# Patient Record
Sex: Female | Born: 1945 | Race: White | Hispanic: No | Smoking: Former smoker
Health system: Southern US, Community
[De-identification: ages and names within clinical notes are randomized; demographics above are authoritative.]

## PROBLEM LIST (undated history)

## (undated) DIAGNOSIS — Z8719 Personal history of other diseases of the digestive system: Secondary | ICD-10-CM

## (undated) DIAGNOSIS — E1110 Type 2 diabetes mellitus with ketoacidosis without coma: Secondary | ICD-10-CM

## (undated) DIAGNOSIS — I214 Non-ST elevation (NSTEMI) myocardial infarction: Secondary | ICD-10-CM

## (undated) DIAGNOSIS — F32A Depression, unspecified: Secondary | ICD-10-CM

## (undated) DIAGNOSIS — I255 Ischemic cardiomyopathy: Secondary | ICD-10-CM

## (undated) DIAGNOSIS — F329 Major depressive disorder, single episode, unspecified: Secondary | ICD-10-CM

## (undated) DIAGNOSIS — F419 Anxiety disorder, unspecified: Secondary | ICD-10-CM

## (undated) DIAGNOSIS — R51 Headache: Secondary | ICD-10-CM

## (undated) DIAGNOSIS — B952 Enterococcus as the cause of diseases classified elsewhere: Secondary | ICD-10-CM

## (undated) DIAGNOSIS — M1990 Unspecified osteoarthritis, unspecified site: Secondary | ICD-10-CM

## (undated) DIAGNOSIS — I739 Peripheral vascular disease, unspecified: Secondary | ICD-10-CM

## (undated) DIAGNOSIS — I2699 Other pulmonary embolism without acute cor pulmonale: Secondary | ICD-10-CM

## (undated) DIAGNOSIS — D696 Thrombocytopenia, unspecified: Secondary | ICD-10-CM

## (undated) DIAGNOSIS — F0390 Unspecified dementia without behavioral disturbance: Secondary | ICD-10-CM

## (undated) DIAGNOSIS — C801 Malignant (primary) neoplasm, unspecified: Secondary | ICD-10-CM

## (undated) DIAGNOSIS — G9340 Encephalopathy, unspecified: Secondary | ICD-10-CM

## (undated) DIAGNOSIS — K219 Gastro-esophageal reflux disease without esophagitis: Secondary | ICD-10-CM

## (undated) DIAGNOSIS — I10 Essential (primary) hypertension: Secondary | ICD-10-CM

## (undated) DIAGNOSIS — D649 Anemia, unspecified: Secondary | ICD-10-CM

## (undated) DIAGNOSIS — E118 Type 2 diabetes mellitus with unspecified complications: Secondary | ICD-10-CM

## (undated) DIAGNOSIS — N390 Urinary tract infection, site not specified: Secondary | ICD-10-CM

## (undated) DIAGNOSIS — I2510 Atherosclerotic heart disease of native coronary artery without angina pectoris: Secondary | ICD-10-CM

## (undated) HISTORY — PX: CORONARY ANGIOPLASTY: SHX604

## (undated) HISTORY — PX: BACK SURGERY: SHX140

## (undated) HISTORY — PX: JOINT REPLACEMENT: SHX530

## (undated) HISTORY — PX: FRACTURE SURGERY: SHX138

## (undated) HISTORY — PX: APPENDECTOMY: SHX54

## (undated) HISTORY — PX: MASTECTOMY: SHX3

## (undated) HISTORY — PX: LEG AMPUTATION BELOW KNEE: SHX694

## (undated) HISTORY — PX: TONSILLECTOMY: SUR1361

## (undated) HISTORY — PX: CHOLECYSTECTOMY: SHX55

## (undated) HISTORY — PX: BREAST SURGERY: SHX581

## (undated) HISTORY — PX: ABDOMINAL HYSTERECTOMY: SHX81

## (undated) HISTORY — PX: CORONARY ARTERY BYPASS GRAFT: SHX141

---

## 2012-12-12 ENCOUNTER — Inpatient Hospital Stay (HOSPITAL_COMMUNITY)
Admission: AD | Admit: 2012-12-12 | Discharge: 2012-12-21 | DRG: 280 | Disposition: A | Discharge status: Home / Self Care | Payer: Medicare Other | Source: Other Acute Inpatient Hospital | Attending: Internal Medicine | Admitting: Internal Medicine

## 2012-12-12 ENCOUNTER — Encounter (HOSPITAL_COMMUNITY): Payer: Self-pay

## 2012-12-12 ENCOUNTER — Inpatient Hospital Stay (HOSPITAL_COMMUNITY): Payer: Medicare Other

## 2012-12-12 DIAGNOSIS — I5021 Acute systolic (congestive) heart failure: Secondary | ICD-10-CM | POA: Diagnosis present

## 2012-12-12 DIAGNOSIS — F0390 Unspecified dementia without behavioral disturbance: Secondary | ICD-10-CM | POA: Diagnosis present

## 2012-12-12 DIAGNOSIS — I509 Heart failure, unspecified: Secondary | ICD-10-CM | POA: Diagnosis present

## 2012-12-12 DIAGNOSIS — E1110 Type 2 diabetes mellitus with ketoacidosis without coma: Secondary | ICD-10-CM | POA: Diagnosis present

## 2012-12-12 DIAGNOSIS — G4700 Insomnia, unspecified: Secondary | ICD-10-CM | POA: Diagnosis present

## 2012-12-12 DIAGNOSIS — I359 Nonrheumatic aortic valve disorder, unspecified: Secondary | ICD-10-CM | POA: Diagnosis present

## 2012-12-12 DIAGNOSIS — X58XXXA Exposure to other specified factors, initial encounter: Secondary | ICD-10-CM | POA: Diagnosis present

## 2012-12-12 DIAGNOSIS — B952 Enterococcus as the cause of diseases classified elsewhere: Secondary | ICD-10-CM

## 2012-12-12 DIAGNOSIS — M1610 Unilateral primary osteoarthritis, unspecified hip: Secondary | ICD-10-CM | POA: Diagnosis present

## 2012-12-12 DIAGNOSIS — I9589 Other hypotension: Secondary | ICD-10-CM | POA: Diagnosis present

## 2012-12-12 DIAGNOSIS — N183 Chronic kidney disease, stage 3 unspecified: Secondary | ICD-10-CM | POA: Diagnosis present

## 2012-12-12 DIAGNOSIS — F411 Generalized anxiety disorder: Secondary | ICD-10-CM | POA: Diagnosis present

## 2012-12-12 DIAGNOSIS — I059 Rheumatic mitral valve disease, unspecified: Secondary | ICD-10-CM

## 2012-12-12 DIAGNOSIS — S88119A Complete traumatic amputation at level between knee and ankle, unspecified lower leg, initial encounter: Secondary | ICD-10-CM

## 2012-12-12 DIAGNOSIS — Z87440 Personal history of urinary (tract) infections: Secondary | ICD-10-CM

## 2012-12-12 DIAGNOSIS — F341 Dysthymic disorder: Secondary | ICD-10-CM | POA: Diagnosis present

## 2012-12-12 DIAGNOSIS — E109 Type 1 diabetes mellitus without complications: Secondary | ICD-10-CM | POA: Diagnosis present

## 2012-12-12 DIAGNOSIS — I2510 Atherosclerotic heart disease of native coronary artery without angina pectoris: Secondary | ICD-10-CM | POA: Diagnosis present

## 2012-12-12 DIAGNOSIS — F418 Other specified anxiety disorders: Secondary | ICD-10-CM | POA: Diagnosis present

## 2012-12-12 DIAGNOSIS — Z86711 Personal history of pulmonary embolism: Secondary | ICD-10-CM

## 2012-12-12 DIAGNOSIS — Z5948 Other specified lack of adequate food: Secondary | ICD-10-CM | POA: Diagnosis present

## 2012-12-12 DIAGNOSIS — Z9861 Coronary angioplasty status: Secondary | ICD-10-CM

## 2012-12-12 DIAGNOSIS — I440 Atrioventricular block, first degree: Secondary | ICD-10-CM | POA: Diagnosis present

## 2012-12-12 DIAGNOSIS — Z951 Presence of aortocoronary bypass graft: Secondary | ICD-10-CM

## 2012-12-12 DIAGNOSIS — N390 Urinary tract infection, site not specified: Secondary | ICD-10-CM | POA: Diagnosis present

## 2012-12-12 DIAGNOSIS — L89109 Pressure ulcer of unspecified part of back, unspecified stage: Secondary | ICD-10-CM | POA: Diagnosis present

## 2012-12-12 DIAGNOSIS — L8992 Pressure ulcer of unspecified site, stage 2: Secondary | ICD-10-CM | POA: Diagnosis present

## 2012-12-12 DIAGNOSIS — T730XXA Starvation, initial encounter: Secondary | ICD-10-CM | POA: Diagnosis present

## 2012-12-12 DIAGNOSIS — Z6828 Body mass index (BMI) 28.0-28.9, adult: Secondary | ICD-10-CM | POA: Diagnosis present

## 2012-12-12 DIAGNOSIS — E876 Hypokalemia: Secondary | ICD-10-CM | POA: Diagnosis present

## 2012-12-12 DIAGNOSIS — I079 Rheumatic tricuspid valve disease, unspecified: Secondary | ICD-10-CM | POA: Diagnosis present

## 2012-12-12 DIAGNOSIS — E669 Obesity, unspecified: Secondary | ICD-10-CM | POA: Diagnosis present

## 2012-12-12 DIAGNOSIS — D649 Anemia, unspecified: Secondary | ICD-10-CM | POA: Diagnosis present

## 2012-12-12 DIAGNOSIS — N179 Acute kidney failure, unspecified: Secondary | ICD-10-CM | POA: Diagnosis present

## 2012-12-12 DIAGNOSIS — Y92009 Unspecified place in unspecified non-institutional (private) residence as the place of occurrence of the external cause: Secondary | ICD-10-CM

## 2012-12-12 DIAGNOSIS — Z794 Long term (current) use of insulin: Secondary | ICD-10-CM

## 2012-12-12 DIAGNOSIS — R5381 Other malaise: Secondary | ICD-10-CM | POA: Diagnosis present

## 2012-12-12 DIAGNOSIS — E1010 Type 1 diabetes mellitus with ketoacidosis without coma: Secondary | ICD-10-CM | POA: Diagnosis present

## 2012-12-12 DIAGNOSIS — K219 Gastro-esophageal reflux disease without esophagitis: Secondary | ICD-10-CM | POA: Diagnosis present

## 2012-12-12 DIAGNOSIS — I255 Ischemic cardiomyopathy: Secondary | ICD-10-CM | POA: Diagnosis present

## 2012-12-12 DIAGNOSIS — Z853 Personal history of malignant neoplasm of breast: Secondary | ICD-10-CM

## 2012-12-12 DIAGNOSIS — E860 Dehydration: Secondary | ICD-10-CM | POA: Diagnosis present

## 2012-12-12 DIAGNOSIS — I129 Hypertensive chronic kidney disease with stage 1 through stage 4 chronic kidney disease, or unspecified chronic kidney disease: Secondary | ICD-10-CM | POA: Diagnosis present

## 2012-12-12 DIAGNOSIS — I2589 Other forms of chronic ischemic heart disease: Secondary | ICD-10-CM | POA: Diagnosis present

## 2012-12-12 DIAGNOSIS — G9340 Encephalopathy, unspecified: Secondary | ICD-10-CM | POA: Diagnosis present

## 2012-12-12 DIAGNOSIS — I214 Non-ST elevation (NSTEMI) myocardial infarction: Secondary | ICD-10-CM | POA: Diagnosis present

## 2012-12-12 DIAGNOSIS — D696 Thrombocytopenia, unspecified: Secondary | ICD-10-CM | POA: Diagnosis present

## 2012-12-12 DIAGNOSIS — I739 Peripheral vascular disease, unspecified: Secondary | ICD-10-CM | POA: Diagnosis present

## 2012-12-12 DIAGNOSIS — D509 Iron deficiency anemia, unspecified: Secondary | ICD-10-CM | POA: Diagnosis present

## 2012-12-12 HISTORY — DX: Encephalopathy, unspecified: G93.40

## 2012-12-12 HISTORY — DX: Anxiety disorder, unspecified: F41.9

## 2012-12-12 HISTORY — DX: Essential (primary) hypertension: I10

## 2012-12-12 HISTORY — DX: Unspecified dementia, unspecified severity, without behavioral disturbance, psychotic disturbance, mood disturbance, and anxiety: F03.90

## 2012-12-12 HISTORY — DX: Malignant (primary) neoplasm, unspecified: C80.1

## 2012-12-12 HISTORY — DX: Peripheral vascular disease, unspecified: I73.9

## 2012-12-12 HISTORY — DX: Depression, unspecified: F32.A

## 2012-12-12 HISTORY — DX: Enterococcus as the cause of diseases classified elsewhere: B95.2

## 2012-12-12 HISTORY — DX: Other pulmonary embolism without acute cor pulmonale: I26.99

## 2012-12-12 HISTORY — DX: Atherosclerotic heart disease of native coronary artery without angina pectoris: I25.10

## 2012-12-12 HISTORY — DX: Headache: R51

## 2012-12-12 HISTORY — DX: Major depressive disorder, single episode, unspecified: F32.9

## 2012-12-12 HISTORY — DX: Gastro-esophageal reflux disease without esophagitis: K21.9

## 2012-12-12 HISTORY — DX: Type 2 diabetes mellitus with unspecified complications: E11.8

## 2012-12-12 HISTORY — DX: Non-ST elevation (NSTEMI) myocardial infarction: I21.4

## 2012-12-12 HISTORY — DX: Anemia, unspecified: D64.9

## 2012-12-12 HISTORY — DX: Thrombocytopenia, unspecified: D69.6

## 2012-12-12 HISTORY — DX: Ischemic cardiomyopathy: I25.5

## 2012-12-12 HISTORY — DX: Type 2 diabetes mellitus with ketoacidosis without coma: E11.10

## 2012-12-12 HISTORY — DX: Personal history of other diseases of the digestive system: Z87.19

## 2012-12-12 HISTORY — DX: Unspecified osteoarthritis, unspecified site: M19.90

## 2012-12-12 HISTORY — DX: Urinary tract infection, site not specified: N39.0

## 2012-12-12 LAB — POCT I-STAT, CHEM 8
BUN: 53 mg/dL — ABNORMAL HIGH (ref 6–23)
Creatinine, Ser: 4.6 mg/dL — ABNORMAL HIGH (ref 0.50–1.10)
Potassium: 4.4 mEq/L (ref 3.5–5.1)
Sodium: 141 mEq/L (ref 135–145)
TCO2: 12 mmol/L (ref 0–100)

## 2012-12-12 LAB — BASIC METABOLIC PANEL
BUN: 51 mg/dL — ABNORMAL HIGH (ref 6–23)
BUN: 52 mg/dL — ABNORMAL HIGH (ref 6–23)
BUN: 44 mg/dL — ABNORMAL HIGH (ref 6–23)
BUN: 41 mg/dL — ABNORMAL HIGH (ref 6–23)
CO2: 9 mEq/L — CL (ref 19–32)
CO2: 12 mEq/L — ABNORMAL LOW (ref 19–32)
Calcium: 7.2 mg/dL — ABNORMAL LOW (ref 8.4–10.5)
Calcium: 8.2 mg/dL — ABNORMAL LOW (ref 8.4–10.5)
Calcium: 8 mg/dL — ABNORMAL LOW (ref 8.4–10.5)
Chloride: 111 mEq/L (ref 96–112)
Creatinine, Ser: 4.85 mg/dL — ABNORMAL HIGH (ref 0.50–1.10)
GFR calc Af Amer: 10 mL/min — ABNORMAL LOW (ref >90)
GFR calc Af Amer: 14 mL/min — ABNORMAL LOW (ref >90)
GFR calc non Af Amer: 8 mL/min — ABNORMAL LOW (ref >90)
GFR calc non Af Amer: 8 mL/min — ABNORMAL LOW (ref >90)
GFR calc non Af Amer: 9 mL/min — ABNORMAL LOW (ref >90)
GFR calc non Af Amer: 12 mL/min — ABNORMAL LOW (ref >90)
GFR calc non Af Amer: 13 mL/min — ABNORMAL LOW (ref >90)
Glucose, Bld: 350 mg/dL — ABNORMAL HIGH (ref 70–99)
Glucose, Bld: 316 mg/dL — ABNORMAL HIGH (ref 70–99)
Glucose, Bld: 189 mg/dL — ABNORMAL HIGH (ref 70–99)
Potassium: 4.5 mEq/L (ref 3.5–5.1)
Potassium: 3.7 mEq/L (ref 3.5–5.1)
Sodium: 137 mEq/L (ref 135–145)
Sodium: 141 mEq/L (ref 135–145)
Sodium: 141 mEq/L (ref 135–145)

## 2012-12-12 LAB — CBC WITH DIFFERENTIAL/PLATELET
Eosinophils Absolute: 0 10*3/uL (ref 0.0–0.7)
Eosinophils Relative: 0 % (ref 0–5)
HCT: 29.5 % — ABNORMAL LOW (ref 36.0–46.0)
Hemoglobin: 10 g/dL — ABNORMAL LOW (ref 12.0–15.0)
Lymphs Abs: 1.9 10*3/uL (ref 0.7–4.0)
MCH: 30.3 pg (ref 26.0–34.0)
MCHC: 33.9 g/dL (ref 30.0–36.0)
MCV: 89.4 fL (ref 78.0–100.0)
Monocytes Absolute: 0.8 10*3/uL (ref 0.1–1.0)
Monocytes Relative: 10 % (ref 3–12)
RBC: 3.3 MIL/uL — ABNORMAL LOW (ref 3.87–5.11)

## 2012-12-12 LAB — MAGNESIUM
Magnesium: 1.4 mg/dL — ABNORMAL LOW (ref 1.5–2.5)
Magnesium: 2 mg/dL (ref 1.5–2.5)

## 2012-12-12 LAB — GLUCOSE, CAPILLARY
Glucose-Capillary: 334 mg/dL — ABNORMAL HIGH (ref 70–99)
Glucose-Capillary: 285 mg/dL — ABNORMAL HIGH (ref 70–99)
Glucose-Capillary: 241 mg/dL — ABNORMAL HIGH (ref 70–99)
Glucose-Capillary: 161 mg/dL — ABNORMAL HIGH (ref 70–99)
Glucose-Capillary: 106 mg/dL — ABNORMAL HIGH (ref 70–99)
Glucose-Capillary: 82 mg/dL (ref 70–99)
Glucose-Capillary: 103 mg/dL — ABNORMAL HIGH (ref 70–99)
Glucose-Capillary: 176 mg/dL — ABNORMAL HIGH (ref 70–99)

## 2012-12-12 LAB — TROPONIN I: Troponin I: 4.45 ng/mL (ref <0.30)

## 2012-12-12 LAB — HEPATIC FUNCTION PANEL
Albumin: 3 g/dL — ABNORMAL LOW (ref 3.5–5.2)
Alkaline Phosphatase: 65 U/L (ref 39–117)
Total Bilirubin: 0.2 mg/dL — ABNORMAL LOW (ref 0.3–1.2)

## 2012-12-12 MED ORDER — HEPARIN (PORCINE) IN NACL 100-0.45 UNIT/ML-% IJ SOLN
1000.0000 [IU]/h | INTRAMUSCULAR | Status: DC
  Administered 2012-12-12: 750 [IU]/h via INTRAVENOUS
  Administered 2012-12-13: 1000 [IU]/h via INTRAVENOUS
  Administered 2012-12-13: 900 [IU]/h via INTRAVENOUS
  Administered 2012-12-13: 1000 [IU]/h via INTRAVENOUS
  Filled 2012-12-12 (×5): qty 250

## 2012-12-12 MED ORDER — HALOPERIDOL LACTATE 5 MG/ML IJ SOLN
INTRAMUSCULAR | Status: AC
  Filled 2012-12-12: qty 1

## 2012-12-12 MED ORDER — HALOPERIDOL LACTATE 5 MG/ML IJ SOLN
5.0000 mg | Freq: Once | INTRAMUSCULAR | Status: AC
  Administered 2012-12-12: 5 mg via INTRAVENOUS

## 2012-12-12 MED ORDER — ALPRAZOLAM 0.25 MG PO TABS: 0.2500 mg | ORAL_TABLET | Freq: Three times a day (TID) | ORAL | Status: DC | PRN

## 2012-12-12 MED ORDER — DEXTROSE 50 % IV SOLN: 25.0000 mL | INTRAVENOUS | Status: DC | PRN

## 2012-12-12 MED ORDER — DEXTROSE-NACL 5-0.45 % IV SOLN
INTRAVENOUS | Status: DC
  Administered 2012-12-12: 06:00:00 via INTRAVENOUS

## 2012-12-12 MED ORDER — ATORVASTATIN CALCIUM 40 MG PO TABS
40.0000 mg | ORAL_TABLET | Freq: Every day | ORAL | Status: DC
  Filled 2012-12-12: qty 1

## 2012-12-12 MED ORDER — MAGNESIUM SULFATE 40 MG/ML IJ SOLN
2.0000 g | Freq: Once | INTRAMUSCULAR | Status: AC
  Administered 2012-12-12: 2 g via INTRAVENOUS
  Filled 2012-12-12: qty 50

## 2012-12-12 MED ORDER — ASPIRIN 81 MG PO CHEW
81.0000 mg | CHEWABLE_TABLET | Freq: Every day | ORAL | Status: DC
  Administered 2012-12-13 – 2012-12-21 (×8): 81 mg via ORAL
  Filled 2012-12-12 (×9): qty 1

## 2012-12-12 MED ORDER — PANTOPRAZOLE SODIUM 40 MG PO TBEC
40.0000 mg | DELAYED_RELEASE_TABLET | Freq: Every day | ORAL | Status: DC
  Administered 2012-12-12 – 2012-12-16 (×5): 40 mg via ORAL
  Filled 2012-12-12 (×5): qty 1

## 2012-12-12 MED ORDER — DEXTROSE 10 % IV SOLN
INTRAVENOUS | Status: DC
  Administered 2012-12-12 – 2012-12-13 (×4): via INTRAVENOUS

## 2012-12-12 MED ORDER — SODIUM CHLORIDE 0.9 % IV SOLN
1.0000 g | Freq: Once | INTRAVENOUS | Status: AC
  Administered 2012-12-12: 1 g via INTRAVENOUS
  Filled 2012-12-12: qty 10

## 2012-12-12 MED ORDER — SODIUM CHLORIDE 0.9 % IV SOLN
INTRAVENOUS | Status: AC
  Administered 2012-12-12: 02:00:00 via INTRAVENOUS

## 2012-12-12 MED ORDER — SODIUM CHLORIDE 0.9 % IV SOLN: INTRAVENOUS | Status: DC

## 2012-12-12 MED ORDER — FENTANYL CITRATE 0.05 MG/ML IJ SOLN
100.0000 ug | INTRAMUSCULAR | Status: DC | PRN
  Administered 2012-12-12: 50 ug via INTRAVENOUS
  Administered 2012-12-15: 100 ug via INTRAVENOUS
  Filled 2012-12-12 (×2): qty 2

## 2012-12-12 MED ORDER — ASPIRIN 325 MG PO TABS
325.0000 mg | ORAL_TABLET | Freq: Every day | ORAL | Status: DC
  Administered 2012-12-12: 325 mg via ORAL
  Filled 2012-12-12: qty 1

## 2012-12-12 MED ORDER — ATORVASTATIN CALCIUM 80 MG PO TABS
80.0000 mg | ORAL_TABLET | Freq: Every day | ORAL | Status: DC
Start: 2012-12-12 — End: 2012-12-21
  Administered 2012-12-13 – 2012-12-20 (×8): 80 mg via ORAL
  Filled 2012-12-12 (×10): qty 1

## 2012-12-12 MED ORDER — HEPARIN SODIUM (PORCINE) 5000 UNIT/ML IJ SOLN: 5000.0000 [IU] | Freq: Three times a day (TID) | INTRAMUSCULAR | Status: DC

## 2012-12-12 MED ORDER — INSULIN REGULAR HUMAN 100 UNIT/ML IJ SOLN
INTRAMUSCULAR | Status: DC
  Administered 2012-12-12: 3.3 [IU]/h via INTRAVENOUS
  Administered 2012-12-13: 1.9 [IU]/h via INTRAVENOUS
  Filled 2012-12-12 (×4): qty 1

## 2012-12-12 MED ORDER — CLOPIDOGREL BISULFATE 75 MG PO TABS
75.0000 mg | ORAL_TABLET | Freq: Every day | ORAL | Status: DC
  Administered 2012-12-12 – 2012-12-21 (×10): 75 mg via ORAL
  Filled 2012-12-12 (×13): qty 1

## 2012-12-12 NOTE — Progress Notes (Signed)
Inpatient Diabetes Program Recommendations  AACE/ADA: New Consensus Statement on Inpatient Glycemic Control (2013)  Target Ranges:  Prepandial:   less than 140 mg/dL      Peak postprandial:   less than 180 mg/dL (1-2 hours)      Critically ill patients:  140 - 180 mg/dL   67 yo female admitted with DKA.  Diabetes Coordinator is following. Thank you  Piedad Climes BSN, RN,CDE Inpatient Diabetes Coordinator [phone] (team pager)

## 2012-12-12 NOTE — Progress Notes (Signed)
ANTICOAGULATION CONSULT NOTE - Follow Up Consult  Pharmacy Consult for Heparin Indication: chest pain/ACS  Allergies  Allergen Reactions  . Ciprofloxacin Hcl   . Nitrofuran Derivatives   . Penicillins   . Quetiapine Fumarate   . Sulfa Antibiotics     Patient Measurements: Height: 5\' 3"  (160 cm) Weight: 161 lb 3.2 oz (73.12 kg) IBW/kg (Calculated) : 52.4 Heparin Dosing Weight: 67 kg  Vital Signs: Temp: 98.3 F (36.8 C) (03/26 0726) Temp src: Oral (03/26 0726) BP: 99/39 mmHg (03/26 0800) Pulse Rate: 102 (03/26 0800)  Labs:  Recent Labs  12/12/12 0115 12/12/12 0229 12/12/12 0230 12/12/12 0300 12/12/12 0809  HGB 10.0* 10.9*  --   --   --   HCT 29.5* 32.0*  --   --   --   PLT 79*  --   --   --   --   HEPARINUNFRC  --   --   --   --  0.26*  CREATININE 4.84* 4.60*  --  4.85* 4.60*  TROPONINI  --   --  4.45*  --  7.72*    Estimated Creatinine Clearance: 11.2 ml/min (by C-G formula based on Cr of 4.6).   Medications:  Scheduled:  . aspirin  325 mg Oral Daily  . atorvastatin  40 mg Oral q1800  . [COMPLETED] calcium gluconate  1 g Intravenous Once  . clopidogrel  75 mg Oral Q breakfast  . [COMPLETED] magnesium sulfate 1 - 4 g bolus IVPB  2 g Intravenous Once  . pantoprazole  40 mg Oral Daily  . [DISCONTINUED] heparin  5,000 Units Subcutaneous Q8H   Infusions:  . [EXPIRED] sodium chloride 999 mL/hr at 12/12/12 0200  . sodium chloride    . dextrose 5 % and 0.45% NaCl 100 mL/hr at 12/12/12 0900  . heparin 750 Units/hr (12/12/12 0900)  . insulin (NOVOLIN-R) infusion 9.1 Units/hr (12/12/12 1204)    Assessment: 67 yo F transferred to [facility] from [facility] with DKA, ARF, AMS, and elevated cardiac enzymes.  Heparin level is subtherapeutic on 750 units/hr of heparin.  No IV issues noted.  Goal of Therapy:  Heparin level 0.3-0.7 units/ml Monitor platelets by anticoagulation protocol: Yes   Plan:  Increase heparin to 900 units/hr. Recheck heparin level in 8  hours. Continue daily heparin level and CBC.  [employer], Pharm.D., BCPS Clinical Pharmacist Pager [phone] 12/12/2012 12:28 PM

## 2012-12-12 NOTE — Consult Note (Signed)
CARDIOLOGY CONSULT NOTE  Patient ID: Christine Hughes MRN: [id number], DOB/AGE: 06-10-1946   Admit date: 12/12/2012 Date of Consult: 12/12/2012   Primary Physician: Katherina Right, MD Primary Cardiologist: B. Earna Coder, MD  Pt. Profile  67 year old female who presents to [facility] with DKA, ARF and elevated Troponins from [facility] ED.   Problem List  Past Medical History  Diagnosis Date  . Coronary artery disease     a. s/p prior MI;  b. CABG 1999 @ [facility]; c. s/p stenting 2009 or 10.  Marland Kitchen Hypertension   . Anxiety   . Depression   . Dementia   . Diabetes mellitus with complication     a. 11/2012 DKA  . GERD (gastroesophageal reflux disease)     Bilateral BKAs  . H/O hiatal hernia   . Headache   . Arthritis   . Anemia   . Cancer     L breast cancer  . Pulmonary embolism     a. prev coumadin    Past Surgical History  Procedure Laterality Date  . Coronary angioplasty    . Back surgery    . Coronary artery bypass graft    . Appendectomy    . Abdominal hysterectomy    . Mastectomy    . Joint replacement      Hip  . Breast surgery      left  . Fracture surgery      R femur  . Cholecystectomy    . Tonsillectomy    . Leg amputation below knee Bilateral     Allergies  Allergies  Allergen Reactions  . Ciprofloxacin Hcl   . Nitrofuran Derivatives   . Penicillins   . Quetiapine Fumarate   . Sulfa Antibiotics    HPI   67 yo female with the above problem list.  She has a long h/o CAD as outlined above, though records are not currently available.  She believes she was last intervened upon sometime in 2009 or 2010, in [place].  She also has a h/o DM and PVD and is s/p bilat BKA's, which limits activity.  She does experience periodic chest pain, usually at rest, approx 1x every few mos.  She rarely has to use sl NTG.  She has a long h/o colitis, which has been worse over the past 4 mos.  Earlier this week developed recurrent diarrhea.  In that setting, she held her usual  insulin dose because she was not eating.  She did have a brief episode of chest discomfort earlier this week, for which, she took sl NTG.  Her dtr began to note alteration in mental status and altered responsiveness.  She was taken to the [facility] ED last night and found be hyperglycemic with acute on chronic renal failure and hyperkalemia. Troponin was elevated as well.  She was  transferred to [facility] for further management of DKA. She currently denies cp,sob, n/v, lightheadedness/dizziness,  diaphoresis, palpitations, DOE, orthopnea.  Troponin has been rising and is currently 7.72.  We've been asked to eval.  Inpatient Medications  . aspirin  81 mg Oral Daily  . atorvastatin  80 mg Oral q1800  . clopidogrel  75 mg Oral Q breakfast  . pantoprazole  40 mg Oral Daily    *  Glucommander  Family History  Pt unwilling to answer at this time.   Social History History   Social History  . Marital Status: Unknown    Spouse Name: N/A    Number of Children: N/A  .  Years of Education: N/A   Occupational History  . Not on file.   Social History Main Topics  . Smoking status: Former Games developer  . Smokeless tobacco: Never Used     Comment: prev tob, quit 1999  . Alcohol Use: No  . Drug Use: No  . Sexually Active: Not on file   Other Topics Concern  . Not on file   Social History Narrative   Lives at [facility]     Review of Systems  General:  No chills, fever, night sweats or weight changes.  Cardiovascular:  ++chest pain, No dyspnea on exertion, edema, orthopnea, palpitations, paroxysmal nocturnal dyspnea. Dermatological: No rash, lesions/masses Respiratory: No cough, dyspnea Urologic: No hematuria, dysuria Abdominal:   ++ Diarrhea, No nausea, vomiting, bright red blood per rectum, melena, or hematemesis Neurologic:  ++ Dementia, No visual changes, wkns All other systems reviewed and are otherwise negative except as noted above.  Physical Exam  Blood pressure 100/37, pulse 98,  temperature 99 F (37.2 C), temperature source Oral, resp. rate 13, height 5\' 3"  (1.6 m), weight 161 lb 3.2 oz (73.12 kg), SpO2 98.00%.  General: Pleasant, NAD Psych: Flat affect. Neuro: Alert and oriented X 3. Moves all extremities spontaneously. HEENT: Normal  Neck: Supple without bruits or JVD. Lungs:  Resp regular and unlabored, fine crackles heard in bilateral lower lobes. Heart: RRR no s3, s4, 2/6 systolic murmur at [facility].  R Carotid Bruit Abdomen: Soft, non-tender, non-distended, BS + x 4.  Extremities: No clubbing, cyanosis or edema. Popliteals 1+ and equal bilaterally, Radials 2+ and equal bilaterally. Bilateral amputations.    Labs   Recent Labs  12/12/12 0230 12/12/12 0809  TROPONINI 4.45* 7.72*   Lab Results  Component Value Date   WBC 8.0 12/12/2012   HGB 10.9* 12/12/2012   HCT 32.0* 12/12/2012   MCV 89.4 12/12/2012   PLT 79* 12/12/2012    Recent Labs Lab 12/12/12 0115  12/12/12 0809  NA 137  < > 141  K 4.0  < > 4.2  CL 111  < > 114*  CO2 9*  < > 12*  BUN 51*  < > 51*  CREATININE 4.84*  < > 4.60*  CALCIUM 7.2*  < > 8.2*  PROT 5.4*  --   --   BILITOT 0.2*  --   --   ALKPHOS 65  --   --   ALT 18  --   --   AST 28  --   --   GLUCOSE 350*  < > 241*  < > = values in this interval not displayed.  Radiology/Studies  Dg Chest Port 1 View  12/12/2012  *RADIOLOGY REPORT*  Clinical Data: Pneumonia.  Short of breath.  PORTABLE CHEST - 1 VIEW  Comparison: 11/29/2012.  Findings: Low volume chest. CABG.  Mild basilar atelectasis. Monitoring leads are projected over the chest. No airspace disease. Negative for pneumonia.  No effusion.  IMPRESSION: Low volume chest without acute cardiopulmonary disease.   Original Report Authenticated By: Andreas Newport, M.D.    ECGg  1st degree AVB, 103 BPM, ST/T wave abnormalities in II, III, AVF, V4-V6. No previous EKGs available for comparison. Low voltage in the limb leads is noted.    ECHO - Left ventricle: Techncially severely  limited. Moderate hypokinesis of inferior and inferolateral walls. EF probably 45%, but can not see apex well. The cavity size was normal. - Aortic valve: Sclerosis without stenosis. No significant regurgitation. - Mitral valve: Mild regurgitation. - Left  atrium: The atrium was mildly dilated. - Right ventricle: Not able to assess RV function due to poor visualization. I suspect some RV dysfunction. The cavity size was mildly dilated. - Tricuspid valve: Poorly visualized. Mild regurgitation.  ASSESSMENT AND PLAN  1. NSTEMI/CAD: Positive Troponins with upward trend in setting of DKA. Last Troponin 7.72. EF 45% per ECHO today.  ? Historic EF.  Will try to obtain records from Dr. Albertina Senegal office.  As she is denying ischemic symptoms at this time, BUN/Creatinine elevated, will plan conservative medical therapy for the time being. Continue ASA, Heparin gtt, Plavix, and Statin therapies. Lipitor increased from 40mg  daily to 80mg  daily.  No bb 2/2 baseline hypotn. We will reconsider cath after retrieving records dependent upon whether or not she has more Ss and whether or not renal fxn improves.  2. Acute Renal Failure: BUN/Creatinine elevated. No baseline labs available. Making adequate urine at this time. Per daughter, she takes Lisinopril and has a nephrologist @ home. Currently holding home Lisinopril dose 2/2 to hypotension and renal failure.  Consider nephrology consult if renal fxn does not return towards baseline.   3. Thrombocytopenia: Platelet count on admission was 79. Heparin gtt infusing. Monitor platelet count closely. May have to d/c Heparin gtt if platelet count continues to drop and bleeding is present.    4. DKA: Remains on insulin gtt. Management per primary team.   Signed, Nicolasa Ducking, NP 12/12/2012, 1:47 PM  Patient seen and examined and case discussed with patient and daughter.  Main issues have clearly been diahreaa over the past several months with a big workup.  She was  not eating, stopped insulin  (long history of IDDM), and then presents with DKA and acute on chronic renal insufficiency.  She has intermittent chest pain, prior CABG with subsequent PCI on DAPT.  Troponons are modestly elevated and have continued to climb somewhat, now 13.73.  She currently however is asymptomatic, and there is no STE on her ECG.  She has reduced LV with an inferolateral wall motion abnormality.  We are trying to get her records from [place].    1.  Currently recommend continuing heparin for now. 2.  Recheck Troponin serially.  3.  Would defer cath in the absence of STE or ongoing ischemic pain given severe renal dysfunction as contrast likely to lead to AKI, and likelihood of finding something that can be changed is not highly likely.    Our team will follow closely.

## 2012-12-12 NOTE — H&P (Signed)
PULMONARY  / CRITICAL CARE MEDICINE  Name: Christine Hughes MRN: [id number] DOB: July 01, 1946    ADMISSION DATE:  12/12/2012 CONSULTATION DATE:  12/12/2012  REFERRING MD :  Transferred from [place]  CHIEF COMPLAINT:  DKA  BRIEF PATIENT DESCRIPTION: 67 yo with frequent UTIs / abx use, subacute diarrhea, DM1, reportedly not taking insulin x 24 hours brought to [facility] ED with acute encephalopathy.  Found to be in DKA, acute renal failure, with elevated troponin.  Transferred to [facility] for further management.   PAST MEDICAL HISTORY :  DM1 Dementia Bilateral BKA CAD (stents, CABG) - Dr Earna Coder in [place], [place].  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 Mega workup per dtr - non diagnostic    SIGNIFICANT EVENTS / STUDIES:  3/26  Admitted from [facility] [place] [facility] ED with DKA, AKI, NSTEMI and encephalopathy  LINES / TUBES: fole  CULTURES: 3/26  Blood >>> 3/26  Urine >>> 3/26 MRSA PCR -neg 3/26  C diff (ordered, not sent) >>    ANTIBIOTICS:  INTERVAL HISTORY: 3/26 10:25 AM rounds: Troponin very high. Non sustained asymptomatioc V TACH ago. Still in acute renal failure   VITAL SIGNS: Temp:  [98.3 F (36.8 C)-98.5 F (36.9 C)] 98.3 F (36.8 C) (03/26 0726) Pulse Rate:  [100-110] 102 (03/26 0800) Resp:  [11-23] 13 (03/26 0800) BP: (86-115)/(10-43) 99/39 mmHg (03/26 0800) SpO2:  [100 %] 100 % (03/26 0800) Weight:  [63.5 kg (139 lb 15.9 oz)-73.12 kg (161 lb 3.2 oz)] 73.12 kg (161 lb 3.2 oz) (03/26 0445)  HEMODYNAMICS:   VENTILATOR SETTINGS:   INTAKE / OUTPUT: Intake/Output     03/25 0701 - 03/26 0700 03/26 0701 - 03/27 0700   I.V. (mL/kg) 2665.4 (36.5) 231.9 (3.2)   IV Piggyback 160    Total Intake(mL/kg) 2825.4 (38.6) 231.9 (3.2)   Urine (mL/kg/hr) 950    Total Output 950     Net +1875.4 +231.9          PHYSICAL EXAMINATION: General:  Appears acutely ill, in nor respiratory distress Neuro:  Encephalopathic,  follows some commands, nonfocal HEENT:  PERRL, dry membranes Cardiovascular:  Tachycardic, regular Lungs:  Bilateral diminished air entry, no w/r/r Abdomen:  Soft, nontender, bowel sounds hyperactive Musculoskeletal:  Bilateral BKA Skin:  Intact  IMAGING x 48h Dg Chest Port 1 View  12/12/2012  *RADIOLOGY REPORT*  Clinical Data: Pneumonia.  Short of breath.  PORTABLE CHEST - 1 VIEW  Comparison: 11/29/2012.  Findings: Low volume chest. CABG.  Mild basilar atelectasis. Monitoring leads are projected over the chest. No airspace disease. Negative for pneumonia.  No effusion.  IMPRESSION: Low volume chest without acute cardiopulmonary disease.   Original Report Authenticated By: Andreas Newport, M.D.       ASSESSMENT / PLAN:  PULMONARY  Recent Labs Lab 12/12/12 0229  TCO2 12    A:  No active issues. P:   Gaol SpO2>92 Supplemental oxygen PRn  CARDIOVASCULAR No results found for this basename: PROBNP,  in the last 168 hours   Recent Labs Lab 12/12/12 0230 12/12/12 0809  TROPONINI 4.45* 7.72*    A: CAD (stents, CABG).  NSTEMI vs demand ischemia in setting of DKA.  Hypotension. Sees Dr Earna Coder in [place], [place]. Has Non sustained V tAch P:  [facility] cards called - dtr wants to make sure they call her local cardiologit Goal MAP>60-65 Trend Troponin 2D echo Heparin gtt Continue ASA, Plavix, Atorvastatin Hold Metoprolol / Isordil (hypotension) and  Lasix / Lisinopril (AKI)   RENAL  Recent Labs Lab 12/12/12 0115 12/12/12 0229 12/12/12 0300  NA 137 141 137  K 4.0 4.4 4.5  CL 111 120* 111  CO2 9*  --  12*  GLUCOSE 350* 345* 316*  BUN 51* 53* 52*  CREATININE 4.84* 4.60* 4.85*  CALCIUM 7.2*  --  7.7*  MG 1.4*  --   --   PHOS 3.2  --   --    Intake/Output     03/25 0701 - 03/26 0700 03/26 0701 - 03/27 0700   I.V. (mL/kg) 2665.4 (36.5) 231.9 (3.2)   IV Piggyback 160    Total Intake(mL/kg) 2825.4 (38.6) 231.9 (3.2)   Urine (mL/kg/hr) 950 400 (1.6)   Total Output 950  400   Net +1875.4 -168.1          A:  AKI in setting of hypovolemia (diarrhea), ACEI, diuretics.  Hypomagnesemia.  Hypocalcemia.   P:   Recheck mag Trend BMP IVF per DKA protocol  GASTROINTESTINAL  Recent Labs Lab 12/12/12 0115  AST 28  ALT 18  ALKPHOS 65  BILITOT 0.2*  PROT 5.4*  ALBUMIN 3.0*    A:  Chronic diarrhea, reportedly negative colonoscopy P:   NPO as encephalopathic Protonix as on PPI preadmission  HEMATOLOGIC  Recent Labs Lab 12/12/12 0115 12/12/12 0229  HGB 10.0* 10.9*  HCT 29.5* 32.0*  WBC 8.0  --   PLT 79*  --    No results found for this basename: INR,  in the last 168 hours  A:  Anemia.  Thrombocytopenia (dtr denies heparin/lovenox psat 30 dayus) P:  Trend CBC DVT Px is not indicated as on Heparin gtt Monitor platelet count   INFECTIOUS  Recent Labs Lab 12/12/12 0327  LATICACIDVEN 1.0   No results found for this basename: PROCALCITON,  in the last 168 hours  A:  No clear source of acute infection.  Multiple abx allergies.  Frequent UTIs. P:   Cultures as above C. Diff PCR Defer antibiotics  ENDOCRINE  CBG (last 3)   Recent Labs  12/12/12 0804 12/12/12 0908 12/12/12 1011  GLUCAP 241* 204* 171*     A:  DM1.  DKA, P:   DKA protocol  NEUROLOGIC A:  Acute encephalopathy.  Dementia.  CT head reportedly negative in [place]. 12/12/12 am rounds - some better P:   Hold Trazodone, Donepezil, Citalopram, Xanax.  T  CRITICAL CARE:  The patient is critically ill with multiple organ systems failure and requires high complexity decision making for assessment and support, frequent evaluation and titration of therapies, application of advanced monitoring technologies and extensive interpretation of multiple databases. Critical Care Time devoted to patient care services described in this note is 46  minutes additional    Dr. Kalman Shan, M.D., [facility].C.P Pulmonary and Critical Care Medicine Staff Physician [place]  [facility] [place] [facility] Pager: [phone], If no answer or between  15:00h - 7:00h: call [phone]  12/12/2012 10:46 AM

## 2012-12-12 NOTE — Progress Notes (Signed)
ANTICOAGULATION CONSULT NOTE - Follow Up Consult  Pharmacy Consult for Heparin Indication: chest pain/ACS  Allergies  Allergen Reactions  . Ciprofloxacin Hcl   . Nitrofuran Derivatives   . Penicillins   . Quetiapine Fumarate   . Sulfa Antibiotics     Patient Measurements: Height: 5\' 3"  (160 cm) Weight: 161 lb 3.2 oz (73.12 kg) IBW/kg (Calculated) : 52.4 Heparin Dosing Weight: 67 kg  Vital Signs: Temp: 98.9 F (37.2 C) (03/26 2000) Temp src: Oral (03/26 2000) BP: 112/38 mmHg (03/26 2100) Pulse Rate: 92 (03/26 2100)  Labs:  Recent Labs  12/12/12 0115 12/12/12 0229 12/12/12 0230 12/12/12 0300 12/12/12 0809 12/12/12 1315 12/12/12 1728 12/12/12 2100  HGB 10.0* 10.9*  --   --   --   --   --   --   HCT 29.5* 32.0*  --   --   --   --   --   --   PLT 79*  --   --   --   --   --   --   --   HEPARINUNFRC  --   --   --   --  0.26*  --   --  0.40  CREATININE 4.84* 4.60*  --  4.85* 4.60*  --  3.68*  --   TROPONINI  --   --  4.45*  --  7.72* 13.73*  --   --     Estimated Creatinine Clearance: 14 ml/min (by C-G formula based on Cr of 3.68).   Assessment: 67 yo F transferred to [facility] from [facility] with DKA, ARF, AMS, and elevated cardiac enzymes.  Continues on heparin infusion. Heparin level is therapeutic after increasing drip this AM.  No IV issues noted.  Goal of Therapy:  Heparin level 0.3-0.7 units/ml Monitor platelets by anticoagulation protocol: Yes   Plan:  - Continue heparin drip at 900 units/hr - Recheck heparin level at 0600 tomorrow - will f/up Daily heparin level and CBC  Thanks, Hale Chalfin K. Allena Katz, PharmD, BCPS.  Clinical Pharmacist Pager [phone]. 12/12/2012 10:23 PM

## 2012-12-12 NOTE — Progress Notes (Signed)
Called to see pt re: increasing Troponin  Pt c/o some anxiety about her medical situation. She is also concerned that she was taken off her Nexium, she is not sure why. She is not having any chest pain or shortness of breath. She is tolerating small amounts of liquids well.  Physical exam - BP 111/39  Pulse 93  Temp(Src) 98.8 F (37.1 C) (Oral)  Resp 18  Ht 5\' 3"  (1.6 m)  Wt 161 lb 3.2 oz (73.12 kg)  BMI 28.56 kg/m2  SpO2 100%  CV - RRR, no m/r/g Lungs - clear bilaterally    Recent Labs  12/12/12 0230 12/12/12 0809 12/12/12 1315  TROPONINI 4.45* 7.72* 13.73*   ECG: 12-Dec-2012 16:46:15  Sinus rhythm with 1st degree A-V block Low voltage QRS Septal infarct , age undetermined Possible Lateral infarct , age undetermined Vent. rate 100 BPM PR interval 230 ms QRS duration 108 ms QT/QTc 272/350 ms P-R-T axes 96 -21 176  Echo: 12/12/2012 - Left ventricle: Techncially severely limited. Moderate hypokinesis of inferior and inferolateral walls. EF probably 45%, but can not see apex well. The cavity size was normal. - Aortic valve: Sclerosis without stenosis. No significant regurgitation. - Mitral valve: Mild regurgitation. - Left atrium: The atrium was mildly dilated. - Right ventricle: Not able to assess RV function due to poor visualization. I suspect some RV dysfunction. The cavity size was mildly dilated. - Tricuspid valve: Poorly visualized. Mild regurgitation.  A/P NSTEMI - Troponin still trending up, will recheck in am. Currently asymptomatic from a cardiac standpoint. She is on ASA, Plavix, Lipitor, No BB for now with borderline HR/BP. No ACE/ARB due to ARF. Continue to follow. Re-assess in am or PRN.  Theodore Demark, PA-C 12/12/2012 5:33 PM   I also came to see the patient.  She remains pain free and comfortable. ECG shows low voltage QRS, and slight ST flattening anteriorly with now flat T waves.  In the absence of ongoing pain or major ST elevation would  defer cath at this point.  She may have occluded an SVG silently.  However, her Cr is quite high and in the setting of DKA risk of AKI due to contrast would be high and the likelihood of major benefit very questionable.  Discussed with daughter.

## 2012-12-12 NOTE — Progress Notes (Signed)
ANTICOAGULATION CONSULT NOTE - Initial Consult  Pharmacy Consult for Heparin Indication: chest pain/ACS  Allergies  Allergen Reactions  . Ciprofloxacin Hcl   . Nitrofuran Derivatives   . Penicillins   . Quetiapine Fumarate   . Sulfa Antibiotics     Patient Measurements: Height: 5\' 3"  (160 cm) Weight: 139 lb 15.9 oz (63.5 kg) IBW/kg (Calculated) : 52.4  Vital Signs: BP: 108/32 mmHg (03/26 0100) Pulse Rate: 101 (03/26 0100)  Labs (at [facility]): WBC 7.2 Hgb 11.9 Hct 35.7 Plt 128  SCr 6.21  INR 1.4  Troponin 5.93 CKMb      23  No results found for this basename: HGB, HCT, PLT, APTT, LABPROT, INR, HEPARINUNFRC, CREATININE, CKTOTAL, CKMB, TROPONINI,  in the last 72 hours  CrCl is unknown because no creatinine reading has been taken.   Medical History: CAD s/p CABG and PCI  Htn  CHF  HLD  DM  Medications:  Lipitor  Lasix  Plavix  Lopressor  ASA  Zestril  Lovaza  Niacin  Imdur  Trazodone  Aricept  Celexa  Nexium  Zyrtec  Novolog  Assessment: 67 yo female with elevated cardiac markers for heparin.  Heparin started at [facility].  Infusing at 800 units/hr on transfer  Goal of Therapy:  Heparin level 0.3-0.7 units/ml Monitor platelets by anticoagulation protocol: Yes   Plan:  Resume heparin 750 units/hr Check heparin level in 6 hours.  Evelio Rueda, Gary Fleet 12/12/2012,1:30 AM

## 2012-12-12 NOTE — Progress Notes (Signed)
  Echocardiogram 2D Echocardiogram has been performed.  Christine Hughes 12/12/2012, 9:12 AM

## 2012-12-12 NOTE — H&P (Signed)
PULMONARY  / CRITICAL CARE MEDICINE  Name: Christine Hughes MRN: [id number] DOB: 1946/08/22    ADMISSION DATE:  12/12/2012 CONSULTATION DATE:  12/12/2012  REFERRING MD :  Transferred from [place]  CHIEF COMPLAINT:  DKA  BRIEF PATIENT DESCRIPTION: 67 yo with frequent UTIs / abx use, subacute diarrhea, DM1, reportedly not taking insulin x 24 hours brought to [facility] ED with acute encephalopathy.  Found to be in DKA, acute renal failure, with elevated troponin.  Transferred to [facility] for further management.  SIGNIFICANT EVENTS / STUDIES:  3/26  Admitted from [facility] ED with DKA, AKI, NSTEMI and encephalopathy  LINES / TUBES:  CULTURES: 3/26  Blood >>> 3/26  Urine >>>  ANTIBIOTICS:  The patient is encephalopathic and unable to provide history, which was obtained for available medical records.  HISTORY OF PRESENT ILLNESS:  67 yo with frequent UTIs / abx use, subacute diarrhea, DM1, reportedly not taking insulin x 24 hours brought to [facility] ED with acute encephalopathy.  Found to be in DKA, acute renal failure, with elevated troponin.  Transferred to [facility] for further management.  PAST MEDICAL HISTORY :  DM1 Dementia Bilateral BKA CAD (stents, CABG) UTI PE Left lumpectomy  MEDICATIONS: Atorvastatin Lasix Plavix Metoprolol Aspirin Lisinopril Niacin Isordil Trazodone Donepezil Citalopram Nexium Zyrtec Insulin  Allergies  Allergen Reactions  . Ciprofloxacin Hcl   . Nitrofuran Derivatives   . Penicillins   . Quetiapine Fumarate   . Sulfa Antibiotics    FAMILY HISTORY:  No family history on file.  SOCIAL HISTORY:  has no tobacco, alcohol, and drug history on file.  REVIEW OF SYSTEMS:  Unable to provide.  INTERVAL HISTORY:  VITAL SIGNS: Pulse Rate:  [101-103] 101 (03/26 0200) Resp:  [11-16] 16 (03/26 0200) BP: (86-115)/(10-43) 88/30 mmHg (03/26 0200) SpO2:  [100 %] 100 % (03/26 0200) Weight:  [63.5 kg (139 lb 15.9 oz)] 63.5 kg (139 lb 15.9 oz) (03/26  0100)  HEMODYNAMICS:   VENTILATOR SETTINGS:   INTAKE / OUTPUT: Intake/Output     03/25 0701 - 03/26 0700   I.V. (mL/kg) 1010.8 (15.9)   Total Intake(mL/kg) 1010.8 (15.9)   Urine (mL/kg/hr) 500   Total Output 500   Net +510.8         PHYSICAL EXAMINATION: General:  Appears acutely ill, in nor respiratory distress Neuro:  Encephalopathic, follows some commands, nonfocal HEENT:  PERRL, dry membranes Cardiovascular:  Tachycardic, regular Lungs:  Bilateral diminished air entry, no w/r/r Abdomen:  Soft, nontender, bowel sounds hyperactive Musculoskeletal:  Bilateral BKA Skin:  Intact  LABS:  Recent Labs Lab 12/12/12 0115 12/12/12 0229 12/12/12 0230  HGB 10.0* 10.9*  --   WBC 8.0  --   --   PLT 79*  --   --   NA 137 141  --   K 4.0 4.4  --   CL 111 120*  --   CO2 9*  --   --   GLUCOSE 350* 345*  --   BUN 51* 53*  --   CREATININE 4.84* 4.60*  --   CALCIUM 7.2*  --   --   MG 1.4*  --   --   PHOS 3.2  --   --   AST 28  --   --   ALT 18  --   --   ALKPHOS 65  --   --   BILITOT 0.2*  --   --   PROT 5.4*  --   --   ALBUMIN 3.0*  --   --  TROPONINI  --   --  4.45*   No results found for this basename: GLUCAP,  in the last 168 hours  CXR:  3/26 >>> nad  ASSESSMENT / PLAN:  PULMONARY A:  No active issues. P:   Gaol SpO2>92 Supplemental oxygen PRn  CARDIOVASCULAR A: CAD (stents, CABG).  NSTEMI vs demand ischemia in setting of DKA.  Hypotension. P:  Goal MAP>60-65 Trend Troponin Lactate 2D echo Heparin gtt Continue ASA, Plavix, Atorvastatin Hold Metoprolol / Isordil (hypotension) and Lasix / Lisinopril (AKI) May need Cardiology consult  RENAL A:  AKI in setting of hypovolemia (diarrhea), ACEI, diuretics.  Hypomagnesemia.  Hypocalcemia.  P:   Trend BMP IVF per DKA protocol  GASTROINTESTINAL A:  Subacute diarrhea, reportedly negative colonoscopy P:   NPO as encephalopathic Protonix as on PPI preadmission  HEMATOLOGIC A:  Anemia.   Thrombocytopenia. P:  Trend CBC DVT Px is not indicated as on Heparin gtt  INFECTIOUS A:  No clear source of acute infection.  Multiple abx allergies.  Frequent UTIs. P:   Cultures as above C. Diff PCR Defer antibiotics  ENDOCRINE  A:  DM1.  DKA, P:   DKA protocol  NEUROLOGIC A:  Acute encephalopathy.  Dementia.  CT head reportedly negative in [place]. P:   Hold Trazodone, Donepezil, Citalopram, Xanax.  TODAY'S SUMMARY: 67 yo with frequent UTIs / abx use, subacute diarrhea, DM1, reportedly not taking insulin x 24 hours brought to [facility] ED with acute encephalopathy.  Found to be in DKA, acute renal failure, with elevated troponin.  Transferred to [facility] for further management.  DKA protocol.  Heparin / ASA / Plavix.  May need Cardiology consult.  I have personally obtained a history, examined the patient, evaluated laboratory and imaging results, formulated the assessment and plan and placed orders.  CRITICAL CARE:  The patient is critically ill with multiple organ systems failure and requires high complexity decision making for assessment and support, frequent evaluation and titration of therapies, application of advanced monitoring technologies and extensive interpretation of multiple databases. Critical Care Time devoted to patient care services described in this note is 60 minutes.   Lonia Farber, MD  Pulmonary and Critical Care Medicine [facility] Pager: [phone]  12/12/2012, 2:17 AM

## 2012-12-12 NOTE — Care Management Note (Addendum)
    Page 1 of 2   12/22/2012     9:28:19 AM   CARE MANAGEMENT NOTE 12/22/2012  Patient:  Christine Hughes, Christine Hughes   Account Number:  [id number]  Date Initiated:  12/12/2012  Documentation initiated by:  Junius Creamer  Subjective/Objective Assessment:   adm w dka     Action/Plan:   lives alone, has aid to assist  5 DAYS A WEEK; 3 HRS A DAY   Anticipated DC Date:  12/21/2012   Anticipated DC Plan:  HOME W HOME HEALTH SERVICES      DC Planning Services  CM consult      Choice offered to / List presented to:          [facility] arranged  HH-1 RN  HH-10 DISEASE MANAGEMENT  HH-2 PT  HH-4 NURSE'S AIDE      HH agency  [facility]   Status of service:  Completed, signed off Medicare Important Message given?   (If response is "NO", the following Medicare IM given date fields will be blank) Date Medicare IM given:   Date Additional Medicare IM given:    Discharge Disposition:  HOME W HOME HEALTH SERVICES  Per UR Regulation:  Reviewed for med. necessity/level of care/duration of stay  If discussed at Long Length of Stay Meetings, dates discussed:    Comments:  ContactJeannetta Ellis Daughter [phone] 12-22-12 0924 Tomi Bamberger, [place] [phone] CM was unable to get in contact with pt and reached daughter instead. Address and telephone number of pt verified with daughter. CM did fax information to [facility] and [facility] to begin within 24-48 hrs of d/c. No further needs from CM.  12/20/12 JULIE AMERSON,RN,BSN [phone] P.T. RECOMMENDING SNF PLACEMENT; CSW TALKED TO PT'S DAUGHTER AND PT ABOUT DC PLANNING TODAY, AND THEY DESIRE TO GO HOME WITH HH CARE.  PT HAS HH AIDE 5 DAYS A WEEK, 3 HRS A DAY, AND DAUGHTER TO ASSIST.  WILL NEED HH FOLLOW UP FOR HHRN, PT AND OT.   THEY DESIRE [facility] FOR HH NEEDS. WILL ARRANGE ONCE ORDERS ARE WRITTEN BY MD.   3/26 1002 debbie dowell rn,bsn

## 2012-12-12 NOTE — Progress Notes (Signed)
eLink Physician-Brief Progress Note Patient Name: Brilee Port DOB: 1946/02/25 MRN: [id number]  Date of Service  12/12/2012   HPI/Events of Note   Acute delirium  eICU Interventions   Xanax d/c'd.  Haldol 5 mg IV x 1.   Intervention Category Intermediate Interventions: Change in mental status - evaluation and management  Mozell Haber 12/12/2012, 9:51 PM

## 2012-12-12 NOTE — Progress Notes (Signed)
INITIAL NUTRITION ASSESSMENT  DOCUMENTATION CODES Per approved criteria  -Obesity Unspecified   INTERVENTION: 1. Add Glucerna Shake po daily, each supplement provides 220 kcal and 10 grams of protein once diet has been advanced 2. Recommend obtaining a HgbA1c to assess blood sugar control prior to admission.  3. RD will continue to follow    NUTRITION DIAGNOSIS: Altered nutrition related lab values related to DM  as evidenced by DKA.   Goal: PO intake to meet >/=90% estimated nutrition needs with good blood sugar control  Monitor:  Blood sugar control, diet advance, weight trends, labs, need for education   Reason for Assessment: Malnutrition Screening Tool   67 y.o. female  Admitting Dx: DKA  ASSESSMENT: Pt admitted with DKA, hx of type 1 DM, chronic diarrhea, Bilateral BKA's. Did not check her blood sugars or take any insulin yesterday. Also now with acute renal failure and NSTEMI.  Pt states that she did not take her insulin or check her sugars because she was having issues with her colitis. Pt endorses unknown amount of weight loss, has not been eating well for several months. Does not know her usual weight. Question accuracy of pt's statements as pt has a hx of dementia. No family available at time of RD visit, family friend in room was not able to provide any additional information.   Height: Ht Readings from Last 1 Encounters:  12/12/12 5\' 3"  (1.6 m)    Weight: Wt Readings from Last 1 Encounters:  12/12/12 161 lb 3.2 oz (73.12 kg)    Ideal Body Weight: 106 lbs adj for Bilateral BKA's  % Ideal Body Weight: 152 %  Wt Readings from Last 10 Encounters:  12/12/12 161 lb 3.2 oz (73.12 kg)    Usual Body Weight: unsure  % Usual Body Weight: --  BMI:  30.9 kg/(m^2) adj for bilateral BKA's, obesity class 1  Estimated Nutritional Needs: Kcal: 1450-1650 Protein: 60-70 gm  Fluid: 1.5-1.7 L  Skin: pink area at groin per flow sheet   Diet Order: NPO  EDUCATION  NEEDS: -Education not appropriate at this time   Intake/Output Summary (Last 24 hours) at 12/12/12 1054 Last data filed at 12/12/12 0900  Gross per 24 hour  Intake 3057.27 ml  Output   1350 ml  Net 1707.27 ml    Last BM: PTA   Labs:   Recent Labs Lab 12/12/12 0115 12/12/12 0229 12/12/12 0300  NA 137 141 137  K 4.0 4.4 4.5  CL 111 120* 111  CO2 9*  --  12*  BUN 51* 53* 52*  CREATININE 4.84* 4.60* 4.85*  CALCIUM 7.2*  --  7.7*  MG 1.4*  --   --   PHOS 3.2  --   --   GLUCOSE 350* 345* 316*    CBG (last 3)   Recent Labs  12/12/12 0804 12/12/12 0908 12/12/12 1011  GLUCAP 241* 204* 171*   No results found for this basename: HGBA1C    Scheduled Meds: . aspirin  325 mg Oral Daily  . atorvastatin  40 mg Oral q1800  . clopidogrel  75 mg Oral Q breakfast  . pantoprazole  40 mg Oral Daily    Continuous Infusions: . sodium chloride    . dextrose 5 % and 0.45% NaCl 100 mL/hr at 12/12/12 0900  . heparin 750 Units/hr (12/12/12 0900)  . insulin (NOVOLIN-R) infusion 13 Units/hr (12/12/12 1478)    Past Medical History  Diagnosis Date  . Coronary artery disease   .  Myocardial infarction   . Hypertension   . Anxiety   . Depression   . Dementia   . Diabetes mellitus without complication   . GERD (gastroesophageal reflux disease)   . H/O hiatal hernia   . Headache   . Arthritis   . Anemia   . Cancer     L breast cancer  . Pulmonary embolism     Past Surgical History  Procedure Laterality Date  . Coronary angioplasty    . Back surgery    . Coronary artery bypass graft    . Appendectomy    . Abdominal hysterectomy    . Mastectomy    . Joint replacement      Hip  . Breast surgery      left  . Fracture surgery      R femur  . Cholecystectomy    . Tonsillectomy    . Leg amputation below knee Bilateral     Clarene Duke RD, LDN Pager [phone] After Hours pager [phone]

## 2012-12-13 LAB — BASIC METABOLIC PANEL
BUN: 38 mg/dL — ABNORMAL HIGH (ref 6–23)
BUN: 35 mg/dL — ABNORMAL HIGH (ref 6–23)
BUN: 28 mg/dL — ABNORMAL HIGH (ref 6–23)
BUN: 26 mg/dL — ABNORMAL HIGH (ref 6–23)
CO2: 12 mEq/L — ABNORMAL LOW (ref 19–32)
CO2: 12 mEq/L — ABNORMAL LOW (ref 19–32)
CO2: 12 mEq/L — ABNORMAL LOW (ref 19–32)
CO2: 14 mEq/L — ABNORMAL LOW (ref 19–32)
Calcium: 7.6 mg/dL — ABNORMAL LOW (ref 8.4–10.5)
Calcium: 7.5 mg/dL — ABNORMAL LOW (ref 8.4–10.5)
Chloride: 115 mEq/L — ABNORMAL HIGH (ref 96–112)
Chloride: 112 mEq/L (ref 96–112)
Chloride: 113 mEq/L — ABNORMAL HIGH (ref 96–112)
Chloride: 112 mEq/L (ref 96–112)
Creatinine, Ser: 2.96 mg/dL — ABNORMAL HIGH (ref 0.50–1.10)
Creatinine, Ser: 2.61 mg/dL — ABNORMAL HIGH (ref 0.50–1.10)
Creatinine, Ser: 2.06 mg/dL — ABNORMAL HIGH (ref 0.50–1.10)
GFR calc Af Amer: 18 mL/min — ABNORMAL LOW (ref >90)
GFR calc Af Amer: 21 mL/min — ABNORMAL LOW (ref >90)
GFR calc Af Amer: 28 mL/min — ABNORMAL LOW (ref >90)
GFR calc non Af Amer: 15 mL/min — ABNORMAL LOW (ref >90)
Glucose, Bld: 162 mg/dL — ABNORMAL HIGH (ref 70–99)
Glucose, Bld: 223 mg/dL — ABNORMAL HIGH (ref 70–99)
Glucose, Bld: 163 mg/dL — ABNORMAL HIGH (ref 70–99)
Potassium: 3.8 mEq/L (ref 3.5–5.1)
Sodium: 139 mEq/L (ref 135–145)

## 2012-12-13 LAB — GLUCOSE, CAPILLARY
Glucose-Capillary: 157 mg/dL — ABNORMAL HIGH (ref 70–99)
Glucose-Capillary: 177 mg/dL — ABNORMAL HIGH (ref 70–99)
Glucose-Capillary: 180 mg/dL — ABNORMAL HIGH (ref 70–99)
Glucose-Capillary: 164 mg/dL — ABNORMAL HIGH (ref 70–99)
Glucose-Capillary: 198 mg/dL — ABNORMAL HIGH (ref 70–99)
Glucose-Capillary: 151 mg/dL — ABNORMAL HIGH (ref 70–99)
Glucose-Capillary: 145 mg/dL — ABNORMAL HIGH (ref 70–99)

## 2012-12-13 LAB — CBC
HCT: 27.9 % — ABNORMAL LOW (ref 36.0–46.0)
MCH: 30.1 pg (ref 26.0–34.0)
MCHC: 34.1 g/dL (ref 30.0–36.0)
MCV: 88.3 fL (ref 78.0–100.0)
Platelets: 66 10*3/uL — ABNORMAL LOW (ref 150–400)
RDW: 15 % (ref 11.5–15.5)

## 2012-12-13 LAB — HEPARIN LEVEL (UNFRACTIONATED): Heparin Unfractionated: 0.34 IU/mL (ref 0.30–0.70)

## 2012-12-13 MED ORDER — SODIUM CHLORIDE 0.45 % IV SOLN
INTRAVENOUS | Status: DC
  Administered 2012-12-13 – 2012-12-15 (×5): via INTRAVENOUS
  Filled 2012-12-13 (×10): qty 100

## 2012-12-13 MED ORDER — SODIUM CHLORIDE 0.9 % IV BOLUS (SEPSIS)
500.0000 mL | Freq: Once | INTRAVENOUS | Status: AC
  Administered 2012-12-13: 500 mL via INTRAVENOUS

## 2012-12-13 MED ORDER — MAGNESIUM SULFATE 40 MG/ML IJ SOLN
2.0000 g | Freq: Once | INTRAMUSCULAR | Status: AC
  Administered 2012-12-13: 2 g via INTRAVENOUS
  Filled 2012-12-13: qty 50

## 2012-12-13 MED ORDER — INSULIN ASPART 100 UNIT/ML ~~LOC~~ SOLN: 0.0000 [IU] | Freq: Every day | SUBCUTANEOUS | Status: DC

## 2012-12-13 MED ORDER — INSULIN ASPART 100 UNIT/ML ~~LOC~~ SOLN
0.0000 [IU] | Freq: Three times a day (TID) | SUBCUTANEOUS | Status: DC
  Administered 2012-12-13: 3 [IU] via SUBCUTANEOUS
  Administered 2012-12-14 (×2): 2 [IU] via SUBCUTANEOUS
  Administered 2012-12-14: 3 [IU] via SUBCUTANEOUS

## 2012-12-13 MED ORDER — POTASSIUM CHLORIDE 10 MEQ/100ML IV SOLN
10.0000 meq | INTRAVENOUS | Status: DC
  Administered 2012-12-13: 10 meq via INTRAVENOUS
  Filled 2012-12-13: qty 200

## 2012-12-13 MED ORDER — POTASSIUM CHLORIDE 10 MEQ/100ML IV SOLN
10.0000 meq | INTRAVENOUS | Status: AC
  Administered 2012-12-13 (×2): 10 meq via INTRAVENOUS

## 2012-12-13 MED ORDER — INSULIN GLARGINE 100 UNIT/ML ~~LOC~~ SOLN
10.0000 [IU] | Freq: Once | SUBCUTANEOUS | Status: AC
  Administered 2012-12-13: 10 [IU] via SUBCUTANEOUS
  Filled 2012-12-13: qty 0.1

## 2012-12-13 MED ORDER — INSULIN GLARGINE 100 UNIT/ML ~~LOC~~ SOLN
10.0000 [IU] | Freq: Every day | SUBCUTANEOUS | Status: DC
  Administered 2012-12-13: 10 [IU] via SUBCUTANEOUS
  Filled 2012-12-13 (×2): qty 0.1

## 2012-12-13 NOTE — Progress Notes (Signed)
Inpatient Diabetes Program Recommendations  AACE/ADA: New Consensus Statement on Inpatient Glycemic Control (2013)  Target Ranges:  Prepandial:   less than 140 mg/dL      Peak postprandial:   less than 180 mg/dL (1-2 hours)      Critically ill patients:  140 - 180 mg/dL  Results for KENNETH, LAX (MRN [id number]) as of 12/13/2012 15:27  Ref. Range 12/13/2012 06:27 12/13/2012 08:06 12/13/2012 10:06 12/13/2012 11:20  Glucose-Capillary Latest Range: 70-99 mg/dL 782 (H) 956 (H) 213 (H) 243 (H)   Inpatient Diabetes Program Recommendations Insulin - Basal: Will likely need more basal insulin  Insulin gtt has been discontinued.  Lantus 10 units given.  If CBGs continue to trend up, add a HS dose of Lantus. Thank you  Piedad Climes BSN, RN,CDE Inpatient Diabetes Coordinator [phone] (team pager)

## 2012-12-13 NOTE — Progress Notes (Signed)
eLink Physician-Brief Progress Note Patient Name: Christine Hughes DOB: [birth year] MRN: [id number]  Date of Service  12/13/2012   HPI/Events of Note   Borderline low BP, hypovolemia on note, no pulm edema  eICU Interventions  biolus   Intervention Category Intermediate Interventions: Hypotension - evaluation and management  Nelda Bucks. 12/13/2012, 12:27 AM

## 2012-12-13 NOTE — Progress Notes (Signed)
Patient ID: Christine Hughes, female   DOB: 1946/08/25, 67 y.o.   MRN: [id number] No chest pain this am  Physical exam - BP 97/39  Pulse 103  Temp(Src) 97.5 F (36.4 C) (Oral)  Resp 15  Ht 5\' 3"  (1.6 m)  Wt 167 lb 8.8 oz (76 kg)  BMI 29.69 kg/m2  SpO2 97%  CV - RRR, no m/r/g Lungs - clear bilaterally Abd: soft non tender positive BS Trace edema Plus 2 PT Neuro nonfocal     Recent Labs  12/12/12 0230 12/12/12 0809 12/12/12 1315 12/12/12 2126  TROPONINI 4.45* 7.72* 13.73* 11.04*   ECG: 12-Dec-2012 16:46:15  Sinus rhythm with 1st degree A-V block Low voltage QRS Septal infarct , age undetermined Possible Lateral infarct , age undetermined Vent. rate 100 BPM PR interval 230 ms QRS duration 108 ms QT/QTc 272/350 ms P-R-T axes 96 -21 176  Echo: 12/12/2012 - Left ventricle: Techncially severely limited. Moderate hypokinesis of inferior and inferolateral walls. EF probably 45%, but can not see apex well. The cavity size was normal. - Aortic valve: Sclerosis without stenosis. No significant regurgitation. - Mitral valve: Mild regurgitation. - Left atrium: The atrium was mildly dilated. - Right ventricle: Not able to assess RV function due to poor visualization. I suspect some RV dysfunction. The cavity size was mildly dilated. - Tricuspid valve: Poorly visualized. Mild regurgitation.  A/P NSTEMI -  See notes by TS No chest pain and no acute ECG changes. Cath prohibited by extremely elevated Cr.  Continue medical Rx On heparin and plavix.   Encephalopathy:  Seems to be improving DKA: per CCM on insulin drip CRF:  Has urine output. Avoid ACE  Charlton Haws, MD 12/13/2012 7:58 AM

## 2012-12-13 NOTE — Progress Notes (Signed)
PULMONARY  / CRITICAL CARE MEDICINE  Name: Christine Hughes MRN: [id number] DOB: 02/21/1946    ADMISSION DATE:  12/12/2012 CONSULTATION DATE:  12/12/2012  REFERRING MD :  Transferred from [place]  CHIEF COMPLAINT:  DKA  BRIEF PATIENT DESCRIPTION: 67 yo with frequent UTIs / abx use, subacute diarrhea, DM1, reportedly not taking insulin x 24 hours brought to [facility] ED with acute encephalopathy.  Found to be in DKA, acute renal failure, with elevated troponin.  Transferred to [facility] for further management.  SIGNIFICANT EVENTS / STUDIES:  3/26  Admitted from [facility] ED with DKA, AKI, NSTEMI and encephalopathy 3/26  2D echo >>> Limited study, EF 45%, mild MR  LINES / TUBES: Foley 3/26 >>>  CULTURES: 3/26  Blood >>> 3/26  Urine >>> 3/26  MRSA PCR >>> neg  ANTIBIOTICS:  INTERVAL HISTORY:  No diarrhea since admission.   VITAL SIGNS: Temp:  [97.4 F (36.3 C)-99 F (37.2 C)] 97.4 F (36.3 C) (03/27 0800) Pulse Rate:  [91-103] 99 (03/27 1000) Resp:  [12-21] 13 (03/27 1000) BP: (88-121)/(25-59) 121/46 mmHg (03/27 1000) SpO2:  [97 %-100 %] 99 % (03/27 1000) Weight:  [76 kg (167 lb 8.8 oz)] 76 kg (167 lb 8.8 oz) (03/27 0500)  HEMODYNAMICS:   VENTILATOR SETTINGS:   INTAKE / OUTPUT: Intake/Output     03/26 0701 - 03/27 0700 03/27 0701 - 03/28 0700   P.O. 360 120   I.V. (mL/kg) 4070.7 (53.6) 738.5 (9.7)   IV Piggyback     Total Intake(mL/kg) 4430.7 (58.3) 858.5 (11.3)   Urine (mL/kg/hr) 2900 (1.6) 300 (1)   Total Output 2900 300   Net +1530.7 +558.5        Urine Occurrence 2 x      PHYSICAL EXAMINATION: General:  NO acute distress Neuro:  Awake, alert, follows commands HEENT:  PERRL, dry membranes Cardiovascular:  RRR, no murmurs Lungs:  Bilateral air entry, no w/r/r Abdomen:  Soft, mild generalized tenderness Musculoskeletal:  Bilateral BKA Skin:  Intact  LABS:  Recent Labs Lab 12/12/12 0115 12/12/12 0229 12/12/12 0230 12/12/12 0300 12/12/12 0327 12/12/12 0809  12/12/12 1315 12/12/12 1728 12/12/12 2126 12/12/12 2127 12/13/12 0143 12/13/12 0620  HGB 10.0* 10.9*  --   --   --   --   --   --   --   --  9.5*  --   WBC 8.0  --   --   --   --   --   --   --   --   --  7.4  --   PLT 79*  --   --   --   --   --   --   --   --   --  66*  --   NA 137 141  --  137  --  141  --  141  --  139 140 140  K 4.0 4.4  --  4.5  --  4.2  --  3.7  --  3.6 3.3* 3.4*  CL 111 120*  --  111  --  114*  --  115*  --  113* 114* 115*  CO2 9*  --   --  12*  --  12*  --  12*  --  12* 14* 12*  GLUCOSE 350* 345*  --  316*  --  241*  --  111*  --  189* 162* 170*  BUN 51* 53*  --  52*  --  51*  --  44*  --  41* 38* 35*  CREATININE 4.84* 4.60*  --  4.85*  --  4.60*  --  3.68*  --  3.31* 2.96* 2.61*  CALCIUM 7.2*  --   --  7.7*  --  8.2*  --  8.1*  --  8.0* 7.6* 7.6*  MG 1.4*  --   --   --   --  2.0 1.7  --   --   --  1.9  --   PHOS 3.2  --   --   --   --   --   --   --   --   --  3.0  --   AST 28  --   --   --   --   --   --   --   --   --   --   --   ALT 18  --   --   --   --   --   --   --   --   --   --   --   ALKPHOS 65  --   --   --   --   --   --   --   --   --   --   --   BILITOT 0.2*  --   --   --   --   --   --   --   --   --   --   --   PROT 5.4*  --   --   --   --   --   --   --   --   --   --   --   ALBUMIN 3.0*  --   --   --   --   --   --   --   --   --   --   --   LATICACIDVEN  --   --   --   --  1.0  --   --   --   --   --   --   --   TROPONINI  --   --  4.45*  --   --  7.72* 13.73*  --  11.04*  --   --   --     Recent Labs Lab 12/13/12 0254 12/13/12 0401 12/13/12 0454 12/13/12 0627 12/13/12 0806  GLUCAP 140* 123* 132* 164* 165*   CXR:  None today  ASSESSMENT / PLAN:  PULMONARY A:  No active issues. P:   Gaol SpO2>92 Supplemental oxygen PRN  CARDIOVASCULAR A: CAD (stents, CABG).  NSTEMI. Acute systolic CHF.  Soft BP. P:  Appreciate Cardiology input Goal MAP>60-65 Heparin gtt, stop after 48 hours Continue ASA, Plavix, Atorvastatin Hold  Metoprolol / Isordil (hypotension) and Lasix / Lisinopril (AKI)  RENAL A:  AKI in setting of hypovolemia (diarrhea), ACEI, diuretics - improving.  Hypomagnesemia.  Hypocalcemia. Mixed acidosis (gap, non-gap) secondary to DKA, starvation, AKI and diarrhea. P:   Mg  2 x 1 K 10 x 2 Trend BMP D/c D10 Start 1/2 NS Bicarbonate gtt @ 100  GASTROINTESTINAL A:  Chronic diarrhea, reportedly negative colonoscopy P:   Advance diet to full liquid Protonix as on PPI preadmission  HEMATOLOGIC A:  Anemia, stable.  Thrombocytopenia, stable.  No overt hemorrhage. P:  Trend CBC DVT Px is not indicated as on Heparin gtt  INFECTIOUS A:  No clear source of acute infection.  Multiple abx allergies.  Frequent UTIs. P:   Cultures as above Defer antibiotics  ENDOCRINE  A:  DM1.  DKA, resolved. P:   Appreciate Endocrinology input D/c Insulin gtt SSI  NEUROLOGIC A:  Dementia.   P:   Holding Trazodone, Donepezil, Citalopram, Xanax.  CLINICAL SUMMARY: NSTEMI on ASA / Plavix / Atorvaststin, cath deferred due to AKI. Stop heparin 3/28.  Combined acidosis (DKA, starvation, diarrhea, AKI) - bicarbonate gtt started, Cr improving.  Off Insulin gtt, Lantus started.  Full liquid diet.  I have personally obtained a history, examined the patient, evaluated laboratory and imaging results, formulated the assessment and plan and placed orders.  Lonia Farber, MD Pulmonary and Critical Care Medicine [facility] Pager: [phone]  12/13/2012, 11:50 AM

## 2012-12-13 NOTE — Progress Notes (Signed)
ANTICOAGULATION CONSULT NOTE - Follow Up Consult  Pharmacy Consult for Heparin Indication: chest pain/ACS  Allergies  Allergen Reactions  . Ciprofloxacin Hcl   . Nitrofuran Derivatives   . Penicillins   . Quetiapine Fumarate   . Sulfa Antibiotics     Patient Measurements: Height: 5\' 3"  (160 cm) Weight: 167 lb 8.8 oz (76 kg) IBW/kg (Calculated) : 52.4 Heparin Dosing Weight: 67 kg  Vital Signs: Temp: 97.4 F (36.3 C) (03/27 0800) Temp src: Oral (03/27 0800) BP: 121/46 mmHg (03/27 1000) Pulse Rate: 99 (03/27 1000)  Labs:  Recent Labs  12/12/12 0115 12/12/12 0229  12/12/12 0809 12/12/12 1315  12/12/12 2100 12/12/12 2126 12/12/12 2127 12/13/12 0143 12/13/12 0620  HGB 10.0* 10.9*  --   --   --   --   --   --   --  9.5*  --   HCT 29.5* 32.0*  --   --   --   --   --   --   --  27.9*  --   PLT 79*  --   --   --   --   --   --   --   --  66*  --   HEPARINUNFRC  --   --   --  0.26*  --   --  0.40  --   --   --  0.34  CREATININE 4.84* 4.60*  < > 4.60*  --   < >  --   --  3.31* 2.96* 2.61*  TROPONINI  --   --   < > 7.72* 13.73*  --   --  11.04*  --   --   --   < > = values in this interval not displayed.  Estimated Creatinine Clearance: 20.1 ml/min (by C-G formula based on Cr of 2.61).   Medications:  Scheduled:  . aspirin  81 mg Oral Daily  . atorvastatin  80 mg Oral q1800  . clopidogrel  75 mg Oral Q breakfast  . [COMPLETED] haloperidol lactate  5 mg Intravenous Once  . [COMPLETED] magnesium sulfate 1 - 4 g bolus IVPB  2 g Intravenous Once  . pantoprazole  40 mg Oral Daily  . [COMPLETED] sodium chloride  500 mL Intravenous Once  . [COMPLETED] sodium chloride  500 mL Intravenous Once  . [DISCONTINUED] aspirin  325 mg Oral Daily  . [DISCONTINUED] atorvastatin  40 mg Oral q1800   Infusions:  . sodium chloride    . dextrose 100 mL/hr at 12/13/12 0858  . heparin 900 Units/hr (12/13/12 0330)  . insulin (NOVOLIN-R) infusion 4.1 mL/hr at 12/13/12 1000  .  [DISCONTINUED] dextrose 5 % and 0.45% NaCl 100 mL/hr at 12/12/12 1300    Assessment: 67 y/o female patient transferred to [facility] from [facility] with DKA, ARF, AMS, and elevated cardiac enzymes.  Heparin level is therapeutic while receiving at 900 units/hr. Plan to continue heparin gtt. Xa level on lower side of goal, will increase rate slightly.  Goal of Therapy:  Heparin level 0.3-0.7 units/ml Monitor platelets by anticoagulation protocol: Yes   Plan:  Increase heparin to 1000 units/hr. Continue daily heparin level and CBC.  Verlene Mayer, PharmD, BCPS Pager [phone] 12/13/2012 10:59 AM

## 2012-12-14 LAB — URINE CULTURE: Colony Count: 60000

## 2012-12-14 LAB — CBC
MCV: 87.6 fL (ref 78.0–100.0)
Platelets: 68 10*3/uL — ABNORMAL LOW (ref 150–400)
RBC: 3.3 MIL/uL — ABNORMAL LOW (ref 3.87–5.11)
RDW: 15.3 % (ref 11.5–15.5)
WBC: 5.3 10*3/uL (ref 4.0–10.5)

## 2012-12-14 LAB — PHOSPHORUS: Phosphorus: 2.8 mg/dL (ref 2.3–4.6)

## 2012-12-14 LAB — GLUCOSE, CAPILLARY

## 2012-12-14 LAB — BASIC METABOLIC PANEL
BUN: 20 mg/dL (ref 6–23)
Creatinine, Ser: 1.49 mg/dL — ABNORMAL HIGH (ref 0.50–1.10)
GFR calc Af Amer: 40 mL/min — ABNORMAL LOW (ref >90)
GFR calc non Af Amer: 35 mL/min — ABNORMAL LOW (ref >90)
Potassium: 3.4 mEq/L — ABNORMAL LOW (ref 3.5–5.1)

## 2012-12-14 LAB — MAGNESIUM: Magnesium: 1.7 mg/dL (ref 1.5–2.5)

## 2012-12-14 LAB — HEPARIN LEVEL (UNFRACTIONATED): Heparin Unfractionated: 0.45 IU/mL (ref 0.30–0.70)

## 2012-12-14 MED ORDER — INSULIN NPH (HUMAN) (ISOPHANE) 100 UNIT/ML ~~LOC~~ SUSP: 17.0000 [IU] | Freq: Every day | SUBCUTANEOUS | Status: DC

## 2012-12-14 MED ORDER — ONDANSETRON HCL 4 MG/2ML IJ SOLN
4.0000 mg | Freq: Three times a day (TID) | INTRAMUSCULAR | Status: DC | PRN
  Administered 2012-12-14 – 2012-12-17 (×4): 4 mg via INTRAVENOUS
  Filled 2012-12-14 (×4): qty 2

## 2012-12-14 MED ORDER — CARVEDILOL 6.25 MG PO TABS
6.2500 mg | ORAL_TABLET | Freq: Two times a day (BID) | ORAL | Status: DC
  Administered 2012-12-14 – 2012-12-21 (×14): 6.25 mg via ORAL
  Filled 2012-12-14 (×18): qty 1

## 2012-12-14 MED ORDER — ONDANSETRON HCL 4 MG/2ML IJ SOLN
INTRAMUSCULAR | Status: AC
  Filled 2012-12-14: qty 2

## 2012-12-14 MED ORDER — INSULIN NPH (HUMAN) (ISOPHANE) 100 UNIT/ML ~~LOC~~ SUSP
24.0000 [IU] | Freq: Every day | SUBCUTANEOUS | Status: DC
  Filled 2012-12-14: qty 10

## 2012-12-14 NOTE — Progress Notes (Signed)
ANTICOAGULATION CONSULT NOTE - Follow Up Consult  Pharmacy Consult for Heparin Indication: chest pain/ACS  Allergies  Allergen Reactions  . Ciprofloxacin Hcl   . Nitrofuran Derivatives   . Penicillins   . Quetiapine Fumarate   . Sulfa Antibiotics     Patient Measurements: Height: 5\' 3"  (160 cm) Weight: 164 lb 10.9 oz (74.7 kg) IBW/kg (Calculated) : 52.4 Heparin Dosing Weight: 67 kg  Vital Signs: Temp: 98.4 F (36.9 C) (03/28 0849) Temp src: Oral (03/28 0849) BP: 131/54 mmHg (03/28 0900) Pulse Rate: 97 (03/28 0900)  Labs:  Recent Labs  12/12/12 0115 12/12/12 0229  12/12/12 0809 12/12/12 1315  12/12/12 2100 12/12/12 2126  12/13/12 0143 12/13/12 0620 12/13/12 0946 12/13/12 1653 12/13/12 1858 12/14/12 0520  HGB 10.0* 10.9*  --   --   --   --   --   --   --  9.5*  --   --   --   --  10.1*  HCT 29.5* 32.0*  --   --   --   --   --   --   --  27.9*  --   --   --   --  28.9*  PLT 79*  --   --   --   --   --   --   --   --  66*  --   --   --   --  68*  HEPARINUNFRC  --   --   < > 0.26*  --   --  0.40  --   --   --  0.34  --   --   --  0.45  CREATININE 4.84* 4.60*  < > 4.60*  --   < >  --   --   < > 2.96* 2.61* 2.44* 2.05* 2.06*  --   TROPONINI  --   --   < > 7.72* 13.73*  --   --  11.04*  --   --   --   --   --   --   --   < > = values in this interval not displayed.  Estimated Creatinine Clearance: 25.3 ml/min (by C-G formula based on Cr of 2.06).   Medications:  Scheduled:  . aspirin  81 mg Oral Daily  . atorvastatin  80 mg Oral q1800  . carvedilol  6.25 mg Oral BID WC  . clopidogrel  75 mg Oral Q breakfast  . insulin aspart  0-15 Units Subcutaneous TID WC  . insulin aspart  0-5 Units Subcutaneous QHS  . insulin glargine  10 Units Subcutaneous QHS  . [COMPLETED] insulin glargine  10 Units Subcutaneous Once  . [COMPLETED] magnesium sulfate 1 - 4 g bolus IVPB  2 g Intravenous Once  . [COMPLETED] ondansetron      . pantoprazole  40 mg Oral Daily  . [COMPLETED]  potassium chloride  10 mEq Intravenous Q1 Hr x 2  . [DISCONTINUED] potassium chloride  10 mEq Intravenous Q1 Hr x 2   Infusions:  . heparin 1,000 Units/hr (12/14/12 0800)  .  sodium bicarbonate  infusion 1000 mL 100 mL/hr at 12/14/12 0915  . [DISCONTINUED] sodium chloride    . [DISCONTINUED] dextrose 100 mL/hr at 12/13/12 1100  . [DISCONTINUED] insulin (NOVOLIN-R) infusion 3 mL/hr at 12/13/12 1100    Assessment: 67 y/o female patient transferred to [facility] from [facility] with DKA, ARF, AMS, and elevated cardiac enzymes.  Heparin level is therapeutic while receiving at 1000  units/hr. Plan to continue heparin gtt until able to cath when scr normalizes.  Goal of Therapy:  Heparin level 0.3-0.7 units/ml Monitor platelets by anticoagulation protocol: Yes   Plan:  Continue heparin gtt at 1000 units/hr. F/u daily heparin level and CBC.  Verlene Mayer, PharmD, BCPS Pager [phone] 12/14/2012 10:36 AM

## 2012-12-14 NOTE — Progress Notes (Signed)
PULMONARY  / CRITICAL CARE MEDICINE  Name: Christine Hughes MRN: [id number] DOB: January 01, 1946    ADMISSION DATE:  12/12/2012 CONSULTATION DATE:  12/12/2012  REFERRING MD :  Transferred from [place]  CHIEF COMPLAINT:  DKA  BRIEF PATIENT DESCRIPTION: 67 yo with frequent UTIs / abx use, subacute diarrhea, DM1, reportedly not taking insulin x 24 hours brought to [facility] ED with acute encephalopathy.  Found to be in DKA, acute renal failure, with elevated troponin.  Transferred to [facility] for further management.  SIGNIFICANT EVENTS / STUDIES:  3/26  Admitted from [facility] ED with DKA, AKI, NSTEMI and encephalopathy 3/26  2D echo >>> Limited study, EF 45%, mild MR  LINES / TUBES: Foley 3/26 >>>  CULTURES: 3/26  Blood >>> 3/26  Urine >>> 3/26  MRSA PCR >>> neg  ANTIBIOTICS:  INTERVAL HISTORY:    VITAL SIGNS: Temp:  [98.3 F (36.8 C)-98.8 F (37.1 C)] 98.4 F (36.9 C) (03/28 0849) Pulse Rate:  [97-112] 101 (03/28 1100) Resp:  [12-30] 17 (03/28 1100) BP: (97-136)/(39-67) 97/43 mmHg (03/28 1100) SpO2:  [98 %-100 %] 100 % (03/28 1100) Weight:  [74.7 kg (164 lb 10.9 oz)] 74.7 kg (164 lb 10.9 oz) (03/28 0500)  HEMODYNAMICS:   VENTILATOR SETTINGS:   INTAKE / OUTPUT: Intake/Output     03/27 0701 - 03/28 0700 03/28 0701 - 03/29 0700   P.O. 120 200   I.V. (mL/kg) 3471.8 (46.5) 330 (4.4)   IV Piggyback 350    Total Intake(mL/kg) 3941.8 (52.8) 530 (7.1)   Urine (mL/kg/hr) 1900 (1.1) 350 (1)   Total Output 1900 350   Net +2041.8 +180        Urine Occurrence 10 x    Stool Occurrence 9 x 2 x     PHYSICAL EXAMINATION: General:  No acute distress Neuro:  Awake, alert, follows commands HEENT:  PERRL, dry membranes Cardiovascular:  RRR, no murmurs Lungs:  Bilateral air entry, no w/r/r Abdomen:  Soft, mild generalized tenderness Musculoskeletal:  Bilateral BKA Skin:  Intact  LABS:  Recent Labs Lab 12/12/12 0115 12/12/12 0229 12/12/12 0230 12/12/12 0300 12/12/12 0327  12/12/12 0809 12/12/12 1315  12/12/12 2126  12/13/12 0143 12/13/12 0620 12/13/12 0946 12/13/12 1653 12/13/12 1858 12/14/12 0520  HGB 10.0* 10.9*  --   --   --   --   --   --   --   --  9.5*  --   --   --   --  10.1*  WBC 8.0  --   --   --   --   --   --   --   --   --  7.4  --   --   --   --  5.3  PLT 79*  --   --   --   --   --   --   --   --   --  66*  --   --   --   --  68*  NA 137 141  --  137  --  141  --   < >  --   < > 140 140 139 138 140  --   K 4.0 4.4  --  4.5  --  4.2  --   < >  --   < > 3.3* 3.4* 3.6 3.8 3.6  --   CL 111 120*  --  111  --  114*  --   < >  --   < >  114* 115* 112 113* 112  --   CO2 9*  --   --  12*  --  12*  --   < >  --   < > 14* 12* 12* 12* 14*  --   GLUCOSE 350* 345*  --  316*  --  241*  --   < >  --   < > 162* 170* 223* 163* 146*  --   BUN 51* 53*  --  52*  --  51*  --   < >  --   < > 38* 35* 32* 28* 26*  --   CREATININE 4.84* 4.60*  --  4.85*  --  4.60*  --   < >  --   < > 2.96* 2.61* 2.44* 2.05* 2.06*  --   CALCIUM 7.2*  --   --  7.7*  --  8.2*  --   < >  --   < > 7.6* 7.6* 7.5* 7.3* 7.6*  --   MG 1.4*  --   --   --   --  2.0 1.7  --   --   --  1.9  --   --   --   --  1.7  PHOS 3.2  --   --   --   --   --   --   --   --   --  3.0  --   --   --   --  2.8  AST 28  --   --   --   --   --   --   --   --   --   --   --   --   --   --   --   ALT 18  --   --   --   --   --   --   --   --   --   --   --   --   --   --   --   ALKPHOS 65  --   --   --   --   --   --   --   --   --   --   --   --   --   --   --   BILITOT 0.2*  --   --   --   --   --   --   --   --   --   --   --   --   --   --   --   PROT 5.4*  --   --   --   --   --   --   --   --   --   --   --   --   --   --   --   ALBUMIN 3.0*  --   --   --   --   --   --   --   --   --   --   --   --   --   --   --   LATICACIDVEN  --   --   --   --  1.0  --   --   --   --   --   --   --   --   --   --   --   TROPONINI  --   --  4.45*  --   --  7.72* 13.73*  --  11.04*  --   --   --   --   --   --   --   < > =  values in this interval not displayed.  Recent Labs Lab 12/13/12 1006 12/13/12 1120 12/13/12 1627 12/13/12 2145 12/14/12 0850  GLUCAP 198* 243* 151* 145* 161*   CXR:  None today  ASSESSMENT / PLAN:  PULMONARY A:  No active issues. P:   [place] SpO2>92 Supplemental oxygen PRN  CARDIOVASCULAR A: CAD (stents, CABG).  NSTEMI likely stress-related. Acute systolic CHF.  Soft BP. P:  Appreciate Cardiology input Goal MAP>60-65 Heparin gtt, stop 3/28 (after 48 hours) Continue ASA, Plavix, Atorvastatin Coreg added 3/28 (was on home metoprolol)  Lasix and isordil held  RENAL A:  AKI in setting of hypovolemia (diarrhea), ACEI, diuretics - improving.  Hypomagnesemia.  Hypocalcemia. Mixed acidosis (gap, non-gap) secondary to DKA, starvation, AKI and diarrhea. S Cr stable at 2.06 on 3/28, overall trend improvement.  P:   Trend BMP pm 3/28 1/2 NS Bicarbonate gtt @ 100  GASTROINTESTINAL A:  Chronic diarrhea, reportedly negative colonoscopy P:   Advance diet as tolerated Protonix as on PPI preadmission  HEMATOLOGIC A:  Anemia, stable.  Thrombocytopenia, stable.  No overt hemorrhage. P:  Trend CBC    Start DVT Px as Heparin gtt stopped 3/28  INFECTIOUS A:  No clear source of acute infection.  Multiple abx allergies.  Frequent UTIs. P:   Cultures as above Defer antibiotics at this time  ENDOCRINE  A:  DM1.  DKA, still with AG of 14 and CO2 of 14 on 3/28. P:   Insulin gtt was stopped 3/27, low threshold to restart given her mixed acidosis and bicarb 14 3/28.  Currently on lantus qhs + SSI. Her home regimen is NPH 48u in am, 34u in pm + SSI > will put her on half dose NPH while her PO intake is poor but low threshold to change back to insulin gtt given her BMP  NEUROLOGIC A:  Dementia.   P:   Holding Trazodone, Donepezil, Citalopram, Xanax.  CLINICAL SUMMARY: NSTEMI on ASA / Plavix / Atorvaststin, cath deferred due to AKI. Stop heparin 3/28.  Combined acidosis (DKA,  starvation, diarrhea, AKI) - bicarbonate gtt started, Cr improving.  Off Insulin gtt, converting to NPH.  Full liquid diet. OK to transfer to SDU  I have personally obtained a history, examined the patient, evaluated laboratory and imaging results, formulated the assessment and plan and placed orders.  Levy Pupa, MD, PhD 12/14/2012, 11:41 AM [facility] [phone] or if no answer [phone]

## 2012-12-14 NOTE — Progress Notes (Signed)
Patient ID: Christine Hughes, female   DOB: 1946/06/05, 67 y.o.   MRN: [id number]  No chest pain this am  Physical exam - BP 130/51  Pulse 102  Temp(Src) 98.3 F (36.8 C) (Oral)  Resp 15  Ht 5\' 3"  (1.6 m)  Wt 164 lb 10.9 oz (74.7 kg)  BMI 29.18 kg/m2  SpO2 100%  CV - RRR, no m/r/g Lungs - clear bilaterally Abd: soft non tender positive BS Trace edema Plus 2 PT Neuro nonfocal     Recent Labs  12/12/12 0230 12/12/12 0809 12/12/12 1315 12/12/12 2126  TROPONINI 4.45* 7.72* 13.73* 11.04*   ECG: 12-Dec-2012 16:46:15  Sinus rhythm with 1st degree A-V block Low voltage QRS Septal infarct , age undetermined Possible Lateral infarct , age undetermined Vent. rate 100 BPM PR interval 230 ms QRS duration 108 ms QT/QTc 272/350 ms P-R-T axes 96 -21 176  Echo: 12/12/2012 - Left ventricle: Techncially severely limited. Moderate hypokinesis of inferior and inferolateral walls. EF probably 45%, but can not see apex well. The cavity size was normal. - Aortic valve: Sclerosis without stenosis. No significant regurgitation. - Mitral valve: Mild regurgitation. - Left atrium: The atrium was mildly dilated. - Right ventricle: Not able to assess RV function due to poor visualization. I suspect some RV dysfunction. The cavity size was mildly dilated. - Tricuspid valve: Poorly visualized. Mild regurgitation.  A/P NSTEMI -  See notes by TS No chest pain and no acute ECG changes. Cath prohibited by extremely elevated Cr.  Continue medical Rx Will add betga blocker back since BP better and relatively tachycardic On heparin and plavix.   Encephalopathy:  Seems to be improving DKA: per CCM on insulin drip CRF:  Has urine output. Avoid ACE  Charlton Haws, MD 12/14/2012 8:27 AM

## 2012-12-15 LAB — BASIC METABOLIC PANEL
CO2: 27 mEq/L (ref 19–32)
Chloride: 108 mEq/L (ref 96–112)
Potassium: 2.9 mEq/L — ABNORMAL LOW (ref 3.5–5.1)
Sodium: 146 mEq/L — ABNORMAL HIGH (ref 135–145)

## 2012-12-15 LAB — GLUCOSE, CAPILLARY
Glucose-Capillary: 129 mg/dL — ABNORMAL HIGH (ref 70–99)
Glucose-Capillary: 157 mg/dL — ABNORMAL HIGH (ref 70–99)
Glucose-Capillary: 289 mg/dL — ABNORMAL HIGH (ref 70–99)

## 2012-12-15 LAB — CBC
HCT: 28.8 % — ABNORMAL LOW (ref 36.0–46.0)
MCV: 87.8 fL (ref 78.0–100.0)
Platelets: 63 10*3/uL — ABNORMAL LOW (ref 150–400)
RBC: 3.28 MIL/uL — ABNORMAL LOW (ref 3.87–5.11)
WBC: 7.9 10*3/uL (ref 4.0–10.5)

## 2012-12-15 LAB — HEPARIN LEVEL (UNFRACTIONATED): Heparin Unfractionated: <0.1 IU/mL — ABNORMAL LOW (ref 0.30–0.70)

## 2012-12-15 MED ORDER — POTASSIUM CHLORIDE 10 MEQ/100ML IV SOLN
10.0000 meq | INTRAVENOUS | Status: AC
  Administered 2012-12-15 (×4): 10 meq via INTRAVENOUS
  Filled 2012-12-15: qty 400

## 2012-12-15 MED ORDER — DEXTROSE 5 % IV SOLN
INTRAVENOUS | Status: DC
  Administered 2012-12-15 – 2012-12-17 (×3): via INTRAVENOUS

## 2012-12-15 MED ORDER — PROMETHAZINE HCL 25 MG/ML IJ SOLN
12.5000 mg | Freq: Once | INTRAMUSCULAR | Status: AC
  Administered 2012-12-15: 12.5 mg via INTRAVENOUS
  Filled 2012-12-15: qty 1

## 2012-12-15 MED ORDER — MAGNESIUM SULFATE 40 MG/ML IJ SOLN
2.0000 g | Freq: Once | INTRAMUSCULAR | Status: AC
  Administered 2012-12-15: 2 g via INTRAVENOUS
  Filled 2012-12-15 (×2): qty 50

## 2012-12-15 MED ORDER — INSULIN ASPART 100 UNIT/ML ~~LOC~~ SOLN
0.0000 [IU] | SUBCUTANEOUS | Status: DC
  Administered 2012-12-15: 2 [IU] via SUBCUTANEOUS
  Administered 2012-12-15: 3 [IU] via SUBCUTANEOUS
  Administered 2012-12-15: 8 [IU] via SUBCUTANEOUS
  Administered 2012-12-16 (×2): 5 [IU] via SUBCUTANEOUS
  Administered 2012-12-16: 2 [IU] via SUBCUTANEOUS
  Administered 2012-12-16: 8 [IU] via SUBCUTANEOUS
  Administered 2012-12-17 (×2): 2 [IU] via SUBCUTANEOUS
  Administered 2012-12-17: 3 [IU] via SUBCUTANEOUS

## 2012-12-15 MED ORDER — ZOLPIDEM TARTRATE 5 MG PO TABS
5.0000 mg | ORAL_TABLET | Freq: Once | ORAL | Status: AC
  Administered 2012-12-16: 5 mg via ORAL
  Filled 2012-12-15: qty 1

## 2012-12-15 NOTE — Progress Notes (Signed)
PULMONARY  / CRITICAL CARE MEDICINE  Name: Christine Hughes MRN: [id number] DOB: 07-May-1946    ADMISSION DATE:  12/12/2012 CONSULTATION DATE:  12/12/2012  REFERRING MD :  Transferred from [place]  CHIEF COMPLAINT:  DKA  BRIEF PATIENT DESCRIPTION: 67 yo with frequent UTIs / abx use, subacute diarrhea, DM1, reportedly not taking insulin x 24 hours brought to [facility] ED with acute encephalopathy.  Found to be in DKA, acute renal failure, with elevated troponin.  Transferred to [facility] for further management.  SIGNIFICANT EVENTS / STUDIES:  3/26  Admitted from [facility] ED with DKA, AKI, NSTEMI and encephalopathy 3/26  2D echo >>> Limited study, EF 45%, mild MR  LINES / TUBES: Foley 3/26 >>>  CULTURES: 3/26  Blood >>> 3/26  Urine > enterococcus 60 k sens  amp 3/26  MRSA PCR >>> neg   VITAL SIGNS: Temp:  [97.8 F (36.6 C)-98.7 F (37.1 C)] 98.7 F (37.1 C) (03/29 1152) Pulse Rate:  [67-103] 101 (03/29 1152) Resp:  [13-21] 14 (03/29 1152) BP: (110-138)/(43-77) 122/60 mmHg (03/29 1152) SpO2:  [95 %-100 %] 95 % (03/29 1152) Weight:  [165 lb 9.1 oz (75.1 kg)-167 lb 8.8 oz (76 kg)] 167 lb 8.8 oz (76 kg) (03/29 0602) FIO2  RA   INTAKE / OUTPUT: Intake/Output     03/28 0701 - 03/29 0700 03/29 0701 - 03/30 0700   P.O. 1750 0   I.V. (mL/kg) 2560 (33.7) 500 (6.6)   IV Piggyback     Total Intake(mL/kg) 4310 (56.7) 500 (6.6)   Urine (mL/kg/hr) 2350 (1.3) 400 (0.8)   Total Output 2350 400   Net +1960 +100        Urine Occurrence 5 x    Stool Occurrence 5 x      SUBJECTIVE/ovenight: n and v, min abd pain  PHYSICAL EXAMINATION: General:  No acute distress Neuro:  Awake, alert, follows commands HEENT:  PERRL, dry membranes Cardiovascular:  RRR, no murmurs Lungs:  Bilateral air entry, no w/r/r Abdomen:  Soft, completely benign  Musculoskeletal:  Bilateral BKA Skin:  Intact  CXR:  None today    Recent Labs Lab 12/13/12 1858 12/14/12 1828 12/15/12 0635  NA 140 142 146*  K  3.6 3.4* 2.9*  CL 112 111 108  CO2 14* 19 27  BUN 26* 20 19  CREATININE 2.06* 1.49* 1.50*  GLUCOSE 146* 155* 140*    Recent Labs Lab 12/13/12 0143 12/14/12 0520 12/15/12 0635  HGB 9.5* 10.1* 9.9*  HCT 27.9* 28.9* 28.8*  WBC 7.4 5.3 7.9  PLT 66* 68* 63*      ASSESSMENT / PLAN:  PULMONARY A:  No active issues. P:   Goal SpO2>92 Supplemental oxygen PRN  CARDIOVASCULAR A: CAD (stents, CABG).  NSTEMI likely stress-related. Acute systolic CHF.     P:  Appreciate Cardiology input  Heparin gtt, stop 3/28 (after 48 hours) Continue ASA, Plavix, Atorvastatin> beware low plts Coreg added 3/28 (was on home metoprolol)  Lasix and isordil held  RENAL A:  AKI in setting of hypovolemia (diarrhea), ACEI, diuretics - improving.  Hypomagnesemia.  Hypocalcemia. Mixed acidosis (gap, non-gap) secondary to DKA, starvation, AKI and diarrhea. S Cr stable at 2.06 on 3/28, overall trend improvement.  P:   Changed to d5w at 74  3/29 [id number] rx 3/29  GASTROINTESTINAL A:  Chronic diarrhea, reportedly negative colonoscopy P:   Advance diet as tolerated Protonix as on PPI preadmission  HEMATOLOGIC A:  Anemia, stable.  Thrombocytopenia, stable.  No overt hemorrhage. P:  Trend CBC    Start DVT Px as Heparin gtt stopped 3/28  INFECTIOUS A:  No clear source of acute infection.  Multiple abx allergies.  Frequent UTIs. P:   Cultures as above Defer antibiotics at this time  ENDOCRINE  A:  DM1.  DKA, still with AG of 14 and CO2 of 14 on 3/28> resolved 3/29 P:   Insulin gtt was stopped 3/27 Currently on lantus qhs + SSI. Her home regimen is NPH 48u in am, 34u in pm + SSI > will put her on half dose NPH while her PO intake is poor   NEUROLOGIC A:  Dementia.   P:   Holding Trazodone, Donepezil, Citalopram, Xanax.  CLINICAL SUMMARY: NSTEMI on ASA / Plavix / Atorvaststin, cath deferred due to AKI. Stopped heparin 3/28.

## 2012-12-15 NOTE — Progress Notes (Signed)
Pt Alert X 2. Continues to have nausea and diarrhea. Poor intake. Accepted pills when daughter is present. Intermittent episodes suspicion. Pt on toileting schedule : extremely flat affect - encouraged to participate with ADLs, turning etc. Intermittent periods bradycardia in PM.

## 2012-12-15 NOTE — Progress Notes (Signed)
eLink Physician-Brief Progress Note Patient Name: Christine Hughes DOB: 05/14/1946 MRN: [id number]  Date of Service  12/15/2012   HPI/Events of Note  Insomnia   eICU Interventions  Ambien 5 mg po times one   Intervention Category Minor Interventions: Routine modifications to care plan (e.g. PRN medications for pain, fever)  Aleks Nawrot 12/15/2012, 11:32 PM

## 2012-12-15 NOTE — Progress Notes (Signed)
eLink Physician-Brief Progress Note Patient Name: Christine Hughes DOB: 1946/08/19 MRN: [id number]  Date of Service  12/15/2012   HPI/Events of Note  Patient with N/V despite dosing with zofran.  Some control of nausea/vomiting for a brief time but not held.  Also on insulin but not able to eat.   eICU Interventions  Plan: NPO for now Change to q4hr SSI - standard scale Phenergan 12.5 mg IV times one dose   Intervention Category Minor Interventions: Routine modifications to care plan (e.g. PRN medications for pain, fever)  Christine Hughes 12/15/2012, 12:31 AM

## 2012-12-15 NOTE — Progress Notes (Signed)
SUBJECTIVE:  No acute complaints.  She said her breathing is OK "off and on."  Denies any ongoing chest pain   PHYSICAL EXAM Filed Vitals:   12/15/12 0355 12/15/12 0500 12/15/12 0602 12/15/12 0811  BP: 119/59 129/64  125/63  Pulse: 91 99  103  Temp:  98.5 F (36.9 C)  97.8 F (36.6 C)  TempSrc:  Oral  Oral  Resp: 15 21  19   Height:      Weight:   167 lb 8.8 oz (76 kg)   SpO2: 98% 100%  98%   General:  No distress Lungs:  Clear Heart:  RRR Abdomen:  Positive bowel sounds, no rebound no guarding Extremities:  Bilateral lower extremity amputations.   LABS:  Results for orders placed during the hospital encounter of 12/12/12 (from the past 24 hour(s))  GLUCOSE, CAPILLARY     Status: Abnormal   Collection Time    12/14/12 12:45 PM      Result Value Range   Glucose-Capillary 149 (*) 70 - 99 mg/dL  GLUCOSE, CAPILLARY     Status: Abnormal   Collection Time    12/14/12  4:23 PM      Result Value Range   Glucose-Capillary 151 (*) 70 - 99 mg/dL  GLUCOSE, CAPILLARY     Status: Abnormal   Collection Time    12/14/12  4:40 PM      Result Value Range   Glucose-Capillary 150 (*) 70 - 99 mg/dL  BASIC METABOLIC PANEL     Status: Abnormal   Collection Time    12/14/12  6:28 PM      Result Value Range   Sodium 142  135 - 145 mEq/L   Potassium 3.4 (*) 3.5 - 5.1 mEq/L   Chloride 111  96 - 112 mEq/L   CO2 19  19 - 32 mEq/L   Glucose, Bld 155 (*) 70 - 99 mg/dL   BUN 20  6 - 23 mg/dL   Creatinine, Ser 1.61 (*) 0.50 - 1.10 mg/dL   Calcium 7.5 (*) 8.4 - 10.5 mg/dL   GFR calc non Af Amer 35 (*) >90 mL/min   GFR calc Af Amer 40 (*) >90 mL/min  GLUCOSE, CAPILLARY     Status: Abnormal   Collection Time    12/14/12  9:34 PM      Result Value Range   Glucose-Capillary 153 (*) 70 - 99 mg/dL   Comment 1 Documented in Chart     Comment 2 Notify RN    GLUCOSE, CAPILLARY     Status: Abnormal   Collection Time    12/15/12 12:40 AM      Result Value Range   Glucose-Capillary 157 (*) 70  - 99 mg/dL   Comment 1 Documented in Chart     Comment 2 Notify RN    GLUCOSE, CAPILLARY     Status: Abnormal   Collection Time    12/15/12  5:48 AM      Result Value Range   Glucose-Capillary 129 (*) 70 - 99 mg/dL  MAGNESIUM     Status: Abnormal   Collection Time    12/15/12  6:35 AM      Result Value Range   Magnesium 1.1 (*) 1.5 - 2.5 mg/dL  PHOSPHORUS     Status: Abnormal   Collection Time    12/15/12  6:35 AM      Result Value Range   Phosphorus 2.2 (*) 2.3 - 4.6 mg/dL  HEPARIN LEVEL (UNFRACTIONATED)  Status: Abnormal   Collection Time    12/15/12  6:35 AM      Result Value Range   Heparin Unfractionated <0.10 (*) 0.30 - 0.70 IU/mL  BASIC METABOLIC PANEL     Status: Abnormal   Collection Time    12/15/12  6:35 AM      Result Value Range   Sodium 146 (*) 135 - 145 mEq/L   Potassium 2.9 (*) 3.5 - 5.1 mEq/L   Chloride 108  96 - 112 mEq/L   CO2 27  19 - 32 mEq/L   Glucose, Bld 140 (*) 70 - 99 mg/dL   BUN 19  6 - 23 mg/dL   Creatinine, Ser 6.04 (*) 0.50 - 1.10 mg/dL   Calcium 7.7 (*) 8.4 - 10.5 mg/dL   GFR calc non Af Amer 35 (*) >90 mL/min   GFR calc Af Amer 40 (*) >90 mL/min  CBC     Status: Abnormal   Collection Time    12/15/12  6:35 AM      Result Value Range   WBC 7.9  4.0 - 10.5 K/uL   RBC 3.28 (*) 3.87 - 5.11 MIL/uL   Hemoglobin 9.9 (*) 12.0 - 15.0 g/dL   HCT 54.0 (*) 98.1 - 19.1 %   MCV 87.8  78.0 - 100.0 fL   MCH 30.2  26.0 - 34.0 pg   MCHC 34.4  30.0 - 36.0 g/dL   RDW 47.8  29.5 - 62.1 %   Platelets 63 (*) 150 - 400 K/uL  GLUCOSE, CAPILLARY     Status: Abnormal   Collection Time    12/15/12  8:10 AM      Result Value Range   Glucose-Capillary 134 (*) 70 - 99 mg/dL   Comment 1 Documented in Chart     Comment 2 Notify RN      Intake/Output Summary (Last 24 hours) at 12/15/12 1140 Last data filed at 12/15/12 1100  Gross per 24 hour  Intake   3570 ml  Output   2050 ml  Net   1520 ml    ASSESSMENT AND PLAN:  NSTEMI (non-ST elevated  myocardial infarction):  Medical management planned.  Beta blocker added yesterday.   Continue ASA and Plavix.      Fayrene Fearing [facility] 12/15/2012 11:40 AM

## 2012-12-16 LAB — GLUCOSE, CAPILLARY
Glucose-Capillary: 140 mg/dL — ABNORMAL HIGH (ref 70–99)
Glucose-Capillary: 204 mg/dL — ABNORMAL HIGH (ref 70–99)
Glucose-Capillary: 268 mg/dL — ABNORMAL HIGH (ref 70–99)

## 2012-12-16 LAB — BASIC METABOLIC PANEL
CO2: 28 mEq/L (ref 19–32)
Calcium: 7.4 mg/dL — ABNORMAL LOW (ref 8.4–10.5)
Creatinine, Ser: 1.51 mg/dL — ABNORMAL HIGH (ref 0.50–1.10)
Glucose, Bld: 105 mg/dL — ABNORMAL HIGH (ref 70–99)
Sodium: 143 mEq/L (ref 135–145)

## 2012-12-16 LAB — CBC
Hemoglobin: 9 g/dL — ABNORMAL LOW (ref 12.0–15.0)
MCH: 30.3 pg (ref 26.0–34.0)
MCV: 89.6 fL (ref 78.0–100.0)
RBC: 2.97 MIL/uL — ABNORMAL LOW (ref 3.87–5.11)

## 2012-12-16 MED ORDER — ACETAMINOPHEN 325 MG PO TABS
650.0000 mg | ORAL_TABLET | ORAL | Status: DC | PRN
  Administered 2012-12-16: 650 mg via ORAL
  Filled 2012-12-16: qty 2

## 2012-12-16 MED ORDER — POTASSIUM CHLORIDE CRYS ER 20 MEQ PO TBCR
40.0000 meq | EXTENDED_RELEASE_TABLET | Freq: Once | ORAL | Status: AC
  Administered 2012-12-16: 40 meq via ORAL
  Filled 2012-12-16: qty 2

## 2012-12-16 MED ORDER — MAGNESIUM SULFATE 40 MG/ML IJ SOLN
2.0000 g | Freq: Once | INTRAMUSCULAR | Status: AC
  Administered 2012-12-16: 2 g via INTRAVENOUS
  Filled 2012-12-16: qty 50

## 2012-12-16 MED ORDER — FAMOTIDINE 20 MG PO TABS
20.0000 mg | ORAL_TABLET | Freq: Every day | ORAL | Status: DC
  Administered 2012-12-16: 20 mg via ORAL
  Filled 2012-12-16 (×2): qty 1

## 2012-12-16 MED ORDER — SACCHAROMYCES BOULARDII 250 MG PO CAPS
250.0000 mg | ORAL_CAPSULE | Freq: Two times a day (BID) | ORAL | Status: DC
  Administered 2012-12-16 – 2012-12-21 (×11): 250 mg via ORAL
  Filled 2012-12-16 (×14): qty 1

## 2012-12-16 NOTE — Progress Notes (Signed)
PULMONARY  / CRITICAL CARE MEDICINE  Name: Christine Hughes MRN: [id number] DOB: Dec 08, 1945    ADMISSION DATE:  12/12/2012 CONSULTATION DATE:  12/12/2012  REFERRING MD :  Transferred from [facility]  CHIEF COMPLAINT:  Possible DKA  BRIEF PATIENT DESCRIPTION: 67 yo with frequent UTIs / abx use, subacute diarrhea, DM1, reportedly not taking insulin x 24 hours brought to [facility] ED with acute encephalopathy.  >  DKA, acute renal failure, with elevated troponin.  Transferred to [facility] for further management.  SIGNIFICANT EVENTS / STUDIES:  3/26  Admitted from [facility] ED with DKA, AKI, NSTEMI and encephalopathy 3/26  2D echo >>> Limited study, EF 45%, mild MR 3/27 Acetone neg  LINES / TUBES: Foley 3/26 >>>  CULTURES: 3/26  Blood >>> 3/26  Urine > enterococcus 60 k sens  amp 3/26  MRSA PCR >>> neg   VITAL SIGNS: Temp:  [98 F (36.7 C)-98.8 F (37.1 C)] 98.8 F (37.1 C) (03/30 0800) Pulse Rate:  [80-101] 81 (03/30 0800) Resp:  [14-23] 15 (03/30 0800) BP: (105-132)/(52-70) 114/55 mmHg (03/30 0800) SpO2:  [73 %-100 %] 100 % (03/30 0800) Weight:  [169 lb 12.1 oz (77 kg)] 169 lb 12.1 oz (77 kg) (03/30 0351) FIO2  2lpm   INTAKE / OUTPUT: Intake/Output     03/29 0701 - 03/30 0700 03/30 0701 - 03/31 0700   P.O. 480 240   I.V. (mL/kg) 1583.8 (20.6)    IV Piggyback 400    Total Intake(mL/kg) 2463.8 (32) 240 (3.1)   Urine (mL/kg/hr) 800 (0.4) 250 (0.9)   Total Output 800 250   Net +1663.8 -10        Urine Occurrence 1 x    Stool Occurrence 4 x      SUBJECTIVE/ovenight: Agitated p ambien, improved this am, taking clear liquids ok C/o diarrhea x sev weeks pta  PHYSICAL EXAMINATION: General:  No acute distress Neuro:  Awake, alert, follows commands HEENT:  PERRL, dry membranes Cardiovascular:  RRR, no murmurs Lungs:  Bilateral air entry, no w/r/r Abdomen:  Soft, completely benign  Musculoskeletal:  Bilateral BKA Skin:  Intact  CXR:  None today    Recent Labs Lab  12/14/12 1828 12/15/12 0635 12/16/12 0700  NA 142 146* 143  K 3.4* 2.9* 2.9*  CL 111 108 104  CO2 19 27 28   BUN 20 19 16   CREATININE 1.49* 1.50* 1.51*  GLUCOSE 155* 140* 105*    Recent Labs Lab 12/14/12 0520 12/15/12 0635 12/16/12 0700  HGB 10.1* 9.9* 9.0*  HCT 28.9* 28.8* 26.6*  WBC 5.3 7.9 8.7  PLT 68* 63* 55*      ASSESSMENT / PLAN:  PULMONARY A:  ? 02 dep resp failure (not clear she really needs it) P:   Goal SpO2>92 Supplemental oxygen PRN Recheck cxr 3/30 >>>  CARDIOVASCULAR A: CAD (stents, CABG).  NSTEMI likely stress-related. Acute systolic CHF.     - 3/26  2D echo >>> Limited study, EF 45%, mild MR P:  Appreciate Cardiology input  Heparin gtt, stop 3/28 (after 48 hours) Continue ASA, Plavix, Atorvastatin> beware low plts Coreg added 3/28 (was on home metoprolol)  Lasix and isordil held  RENAL A:  AKI in setting of hypovolemia (diarrhea), ACEI, diuretics - improving.  Hypomagnesemia.  Hypocalcemia. Mixed acidosis (gap, non-gap)  starvation, AKI and diarrhea.  overall trend improving P:   Changed to d5w at 75  3/29 Mgs04 rx 3/29 and 3/30 x 4 gm total KCL 40 meq x 1 3/30  GASTROINTESTINAL  A:  Chronic diarrhea, reportedly negative colonoscopy - Repeat c diff sent 3/30 P:   Advance diet as tolerated Changed ppi to pepcid 3/30 and added florastor  HEMATOLOGIC A:  Anemia, stable.  Thrombocytopenia, stable.  No overt hemorrhage on asa/plavix P:  Trend CBC    Heparin gtt stopped 3/28  INFECTIOUS A:  No clear source of acute infection.  Multiple abx allergies.  Frequent UTIs. P:   Cultures as above Defer antibiotics unless definite source as risk c diff/ resistant uit  ENDOCRINE  A:  DM1.  DKA excluded by nl blood acetone 3/27 P:   Insulin gtt was stopped 3/27 Currently on lantus qhs + SSI. Her home regimen is NPH 48u in am, 34u in pm + SSI > will put her on half dose NPH while her PO intake is poor   NEUROLOGIC A:  Dementia.   P:    Holding Trazodone, Donepezil, Citalopram, Xanax.  CLINICAL SUMMARY:   Pt has clearly improved awaiting cath but still has creat around 1.5 and low plts  Ok for transfer to [facility] 3/30  PCCM f/u prn   Sandrea Hughs, MD Pulmonary and Critical Care Medicine [facility] Cell [phone] After 5:30 PM or weekends, call [phone]

## 2012-12-16 NOTE — Progress Notes (Signed)
No nausea or diarrhea today. Pt remained continent of urine and using call bell appropriately. Some confusion noted upon awakening from naps. Attention given to healing stage 2 on anal fold.

## 2012-12-16 NOTE — Progress Notes (Signed)
Pt became very confused and attempted to get OOB. Pt also removed her IVs, tele leads, and gown. Pt is refusing to have IV reinserted at this time.

## 2012-12-16 NOTE — Progress Notes (Signed)
Pt is still very confused and is still refusing to have IV re-inserted. Will keep orienting pt.

## 2012-12-17 LAB — URINALYSIS, ROUTINE W REFLEX MICROSCOPIC
Bilirubin Urine: NEGATIVE
Ketones, ur: NEGATIVE mg/dL
Nitrite: NEGATIVE
Protein, ur: NEGATIVE mg/dL

## 2012-12-17 LAB — URINE MICROSCOPIC-ADD ON

## 2012-12-17 LAB — GLUCOSE, CAPILLARY
Glucose-Capillary: 142 mg/dL — ABNORMAL HIGH (ref 70–99)
Glucose-Capillary: 186 mg/dL — ABNORMAL HIGH (ref 70–99)
Glucose-Capillary: 262 mg/dL — ABNORMAL HIGH (ref 70–99)

## 2012-12-17 MED ORDER — MAGNESIUM SULFATE 40 MG/ML IJ SOLN
2.0000 g | Freq: Once | INTRAMUSCULAR | Status: AC
  Administered 2012-12-17: 2 g via INTRAVENOUS
  Filled 2012-12-17 (×2): qty 50

## 2012-12-17 MED ORDER — LIVING WELL WITH DIABETES BOOK
Freq: Once | Status: AC
  Administered 2012-12-17: 12:00:00
  Filled 2012-12-17: qty 1

## 2012-12-17 MED ORDER — INSULIN ASPART 100 UNIT/ML ~~LOC~~ SOLN
0.0000 [IU] | SUBCUTANEOUS | Status: DC
  Administered 2012-12-17: 3 [IU] via SUBCUTANEOUS

## 2012-12-17 MED ORDER — PANTOPRAZOLE SODIUM 40 MG PO TBEC
40.0000 mg | DELAYED_RELEASE_TABLET | Freq: Every day | ORAL | Status: DC
  Administered 2012-12-18 – 2012-12-21 (×3): 40 mg via ORAL
  Filled 2012-12-17 (×5): qty 1

## 2012-12-17 MED ORDER — INSULIN ASPART 100 UNIT/ML ~~LOC~~ SOLN: 0.0000 [IU] | Freq: Three times a day (TID) | SUBCUTANEOUS | Status: DC

## 2012-12-17 MED ORDER — POTASSIUM CHLORIDE CRYS ER 20 MEQ PO TBCR
40.0000 meq | EXTENDED_RELEASE_TABLET | Freq: Once | ORAL | Status: AC
  Administered 2012-12-17: 40 meq via ORAL
  Filled 2012-12-17: qty 2

## 2012-12-17 MED ORDER — POTASSIUM CHLORIDE IN NACL 20-0.45 MEQ/L-% IV SOLN
INTRAVENOUS | Status: DC
  Administered 2012-12-17: 1000 mL via INTRAVENOUS
  Administered 2012-12-18: 18:00:00 via INTRAVENOUS
  Filled 2012-12-17 (×7): qty 1000

## 2012-12-17 MED ORDER — INSULIN ASPART 100 UNIT/ML ~~LOC~~ SOLN
0.0000 [IU] | Freq: Three times a day (TID) | SUBCUTANEOUS | Status: DC
  Administered 2012-12-18: 3 [IU] via SUBCUTANEOUS
  Administered 2012-12-18: 8 [IU] via SUBCUTANEOUS
  Administered 2012-12-18: 3 [IU] via SUBCUTANEOUS
  Administered 2012-12-19 (×2): 5 [IU] via SUBCUTANEOUS
  Administered 2012-12-19 – 2012-12-20 (×2): 8 [IU] via SUBCUTANEOUS

## 2012-12-17 MED ORDER — INSULIN ASPART 100 UNIT/ML ~~LOC~~ SOLN
0.0000 [IU] | Freq: Every day | SUBCUTANEOUS | Status: AC
  Administered 2012-12-17: 8 [IU] via SUBCUTANEOUS
  Administered 2012-12-17: 2 [IU] via SUBCUTANEOUS

## 2012-12-17 MED ORDER — ISOSORBIDE MONONITRATE 15 MG HALF TABLET
15.0000 mg | ORAL_TABLET | Freq: Every day | ORAL | Status: DC
  Administered 2012-12-17 – 2012-12-21 (×5): 15 mg via ORAL
  Filled 2012-12-17 (×5): qty 1

## 2012-12-17 MED ORDER — FENTANYL CITRATE 0.05 MG/ML IJ SOLN: 50.0000 ug | INTRAMUSCULAR | Status: DC | PRN

## 2012-12-17 MED ORDER — ONDANSETRON HCL 4 MG/2ML IJ SOLN
4.0000 mg | INTRAMUSCULAR | Status: DC | PRN
  Administered 2012-12-17 – 2012-12-19 (×5): 4 mg via INTRAVENOUS
  Filled 2012-12-17 (×5): qty 2

## 2012-12-17 NOTE — Progress Notes (Signed)
Patient ID: Eulla Kochanowski, female   DOB: 11/06/1945, 67 y.o.   MRN: [id number] Primary Cardiologist: Here Dr Franki Monte [place]:  Dr Dorthea Cove  SUBJECTIVE:  No acute complaints.  Daughter with lot of questions   PHYSICAL EXAM Filed Vitals:   12/16/12 2000 12/17/12 0006 12/17/12 0442 12/17/12 0734  BP: 112/58 125/55 133/64 125/59  Pulse: 84 78 75 79  Temp: 99.3 F (37.4 C) 98.6 F (37 C) 98.2 F (36.8 C) 97.8 F (36.6 C)  TempSrc: Core (Comment) Oral Oral Oral  Resp: 20 19 13 13   Height:      Weight:   169 lb 12.1 oz (77 kg)   SpO2: 99% 98% 99% 100%   General:  No distress Lungs:  Clear Heart:  RRR Abdomen:  Positive bowel sounds, no rebound no guarding Extremities:  Bilateral lower extremity amputations.   LABS:  Results for orders placed during the hospital encounter of 12/12/12 (from the past 24 hour(s))  GLUCOSE, CAPILLARY     Status: None   Collection Time    12/16/12  8:03 AM      Result Value Range   Glucose-Capillary 99  70 - 99 mg/dL  GLUCOSE, CAPILLARY     Status: Abnormal   Collection Time    12/16/12 12:40 PM      Result Value Range   Glucose-Capillary 199 (*) 70 - 99 mg/dL  GLUCOSE, CAPILLARY     Status: Abnormal   Collection Time    12/16/12  4:09 PM      Result Value Range   Glucose-Capillary 268 (*) 70 - 99 mg/dL  GLUCOSE, CAPILLARY     Status: Abnormal   Collection Time    12/16/12  7:44 PM      Result Value Range   Glucose-Capillary 276 (*) 70 - 99 mg/dL  GLUCOSE, CAPILLARY     Status: Abnormal   Collection Time    12/17/12  4:33 AM      Result Value Range   Glucose-Capillary 142 (*) 70 - 99 mg/dL  MAGNESIUM     Status: Abnormal   Collection Time    12/17/12  6:40 AM      Result Value Range   Magnesium 1.4 (*) 1.5 - 2.5 mg/dL  PHOSPHORUS     Status: None   Collection Time    12/17/12  6:40 AM      Result Value Range   Phosphorus 2.7  2.3 - 4.6 mg/dL    Intake/Output Summary (Last 24 hours) at 12/17/12 0739 Last data filed at  12/17/12 0656  Gross per 24 hour  Intake   2560 ml  Output   2125 ml  Net    435 ml   . aspirin  81 mg Oral Daily  . atorvastatin  80 mg Oral q1800  . carvedilol  6.25 mg Oral BID WC  . clopidogrel  75 mg Oral Q breakfast  . famotidine  20 mg Oral QHS  . insulin aspart  0-15 Units Subcutaneous Q4H  . saccharomyces boulardii  250 mg Oral BID    ASSESSMENT AND PLAN:  NSTEMI :  Continue beta blocker ASA and Plavix add nitrates.  Medical Rx planned F/U in [place] DM:  Admitted with DKA  ? Need for home health.  Dehydration and renal function improved  Will sign off Discussed need for f/u in [place] with their cardiologist with patient and daughter    Charlton Haws 12/17/2012 7:39 AM

## 2012-12-17 NOTE — Progress Notes (Signed)
[facility] Progress Note [place] TEAM 1 - Stepdown/ICU TEAM   Lyndal Pulley XBJ:[id number] DOB: 1945-11-07 DOA: 12/12/2012 PCP: Katherina Right, MD  Brief narrative: 67 yo with frequent UTIs / abx use, subacute diarrhea (2 moonth hx of), DM1 reportedly not taking insulin x 24 hours taken to [facility] ED with acute encephalopathy and diagnosed with DKA, acute renal failure, and elevated troponin. Transferred to [facility] for further management on 3/26.    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 Care as per Cardiology - cath deferred due to AKI - to f/u with her Cardiologist in [place]  Encephalopathy acute in setting of dementia Appears to be improving - alert and conversant today, though intermittently somnolent   Acute renal failure in setting of hypovolemia (diarrhea), ACEI, diuretics - overall trend has been of improvement - f/u in AM  Chronic diarrhea reportedly negative colonoscopy as outpt - reports sx for 2months - Cdiff ordered but pt has not had a BM in 48hrs - cont to monitor  Anemia Normocytic anemia - will check anemia panel - no evidence of blood loss  Hypocalcemia Recheck Ca in AM  Hypokalemia Replace and recheck in AM  Hypomagnesemia  Recheck in AM  Thrombocytopenia No clear etiology at present - avoid heparin products - recheck in AM - stop pepcid  Code Status: FULL Family Communication: spoke at length w/ pt and daughter at bedside Disposition Plan: SDU  Consultants: Cardiology PCCM  Procedures: none  Antibiotics: none  DVT  prophylaxis: SCDs  HPI/Subjective: Pt is conversant and polite, though mildly somnolent,  She c/o crampy diffuse abdom discomfort, and some nausea w/o vomiting.  She denies CP, HA, or diarrhea.  She has not move her bowels in > 48hrs.  Objective: Blood pressure 129/58, pulse 81, temperature 98.2 F (36.8 C), temperature source Oral, resp. rate 16, height 5\' 3"  (1.6 m), weight 77 kg (169 lb 12.1 oz), SpO2 100.00%.  Intake/Output Summary (Last 24 hours) at 12/17/12 1412 Last data filed at 12/17/12 1200  Gross per 24 hour  Intake   2055 ml  Output   1075 ml  Net    980 ml    Exam: General: No acute respiratory distress Lungs: Clear to auscultation bilaterally without wheezes or crackles Cardiovascular: Regular rate and rhythm without murmur gallop or rub normal S1 and S2 Abdomen: Nondistended, mildly tender to palpation diffusely, soft, bowel sounds positive, no rebound, no ascites, no appreciable mass Extremities: No significant cyanosis, clubbing, or edema bilateral lower extremities - s/p B BKA  Data Reviewed: Basic Metabolic Panel:  Recent Labs Lab 12/13/12 0143  12/13/12 1653 12/13/12 1858 12/14/12 0520 12/14/12 1828 12/15/12 0635 12/16/12 0700 12/17/12 0640  NA 140  < > 138 140  --  142 146* 143  --   K 3.3*  < > 3.8 3.6  --  3.4* 2.9* 2.9*  --   CL 114*  < > 113* 112  --  111 108 104  --   CO2 14*  < > 12* 14*  --  19  27 28  --   GLUCOSE 162*  < > 163* 146*  --  155* 140* 105*  --   BUN 38*  < > 28* 26*  --  20 19 16   --   CREATININE 2.96*  < > 2.05* 2.06*  --  1.49* 1.50* 1.51*  --   CALCIUM 7.6*  < > 7.3* 7.6*  --  7.5* 7.7* 7.4*  --   MG 1.9  --   --   --  1.7  --  1.1* 1.3* 1.4*  PHOS 3.0  --   --   --  2.8  --  2.2* 2.2* 2.7  < > = values in this interval not displayed. Liver Function Tests:  Recent Labs Lab 12/12/12 0115  AST 28  ALT 18  ALKPHOS 65  BILITOT 0.2*  PROT 5.4*  ALBUMIN 3.0*   CBC:  Recent Labs Lab 12/12/12 0115 12/12/12 0229  12/13/12 0143 12/14/12 0520 12/15/12 0635 12/16/12 0700  WBC 8.0  --  7.4 5.3 7.9 8.7  NEUTROABS 5.3  --   --   --   --   --   HGB 10.0* 10.9* 9.5* 10.1* 9.9* 9.0*  HCT 29.5* 32.0* 27.9* 28.9* 28.8* 26.6*  MCV 89.4  --  88.3 87.6 87.8 89.6  PLT 79*  --  66* 68* 63* 55*   Cardiac Enzymes:  Recent Labs Lab 12/12/12 0230 12/12/12 0809 12/12/12 1315 12/12/12 2126  TROPONINI 4.45* 7.72* 13.73* 11.04*   CBG:  Recent Labs Lab 12/16/12 1944 12/17/12 0008 12/17/12 0433 12/17/12 0739 12/17/12 1147  GLUCAP 276* 142* 142* 159* 186*    Recent Results (from the past 240 hour(s))  MRSA PCR SCREENING     Status: None   Collection Time    12/12/12  1:44 AM      Result Value Range Status   MRSA by PCR NEGATIVE  NEGATIVE Final   Comment:            The GeneXpert MRSA Assay (FDA     approved for NASAL specimens     only), is one component of a     comprehensive MRSA colonization     surveillance program. It is not     intended to diagnose MRSA     infection nor to guide or     monitor treatment for     MRSA infections.  CULTURE, BLOOD (ROUTINE X 2)     Status: None   Collection Time    12/12/12  3:25 AM      Result Value Range Status   Specimen Description BLOOD LEFT HAND   Final   Special Requests BOTTLES DRAWN AEROBIC ONLY 2CC   Final   Culture  Setup Time 12/12/2012 08:53   Final   Culture     Final   Value:        BLOOD CULTURE RECEIVED NO GROWTH TO DATE CULTURE WILL BE HELD FOR 5 DAYS BEFORE ISSUING A FINAL NEGATIVE REPORT   Report Status PENDING   Incomplete  CULTURE, BLOOD (ROUTINE X 2)     Status: None   Collection Time    12/12/12  3:30 AM      Result Value Range Status   Specimen Description BLOOD RIGHT HAND   Final   Special Requests BOTTLES DRAWN AEROBIC ONLY [facility]   Final   Culture  Setup Time 12/12/2012 08:52   Final   Culture     Final   Value:  BLOOD CULTURE RECEIVED NO GROWTH TO DATE CULTURE WILL BE HELD FOR 5 DAYS BEFORE ISSUING A FINAL NEGATIVE  REPORT   Report Status PENDING   Incomplete  URINE CULTURE     Status: None   Collection Time    12/12/12  5:47 AM      Result Value Range Status   Specimen Description URINE, CLEAN CATCH   Final   Special Requests NONE   Final   Culture  Setup Time 12/12/2012 05:47   Final   Colony Count 60,000 COLONIES/ML   Final   Culture     Final   Value: STAPHYLOCOCCUS SPECIES (COAGULASE NEGATIVE)     Note: RIFAMPIN AND GENTAMICIN SHOULD NOT BE USED AS SINGLE DRUGS FOR TREATMENT OF STAPH INFECTIONS.     ENTEROCOCCUS SPECIES   Report Status 12/14/2012 FINAL   Final   Organism ID, Bacteria STAPHYLOCOCCUS SPECIES (COAGULASE NEGATIVE)   Final   Organism ID, Bacteria ENTEROCOCCUS SPECIES   Final     Studies:  Recent x-ray studies have been reviewed in detail by the Attending Physician  Scheduled Meds:  Scheduled Meds: . aspirin  81 mg Oral Daily  . atorvastatin  80 mg Oral q1800  . carvedilol  6.25 mg Oral BID WC  . clopidogrel  75 mg Oral Q breakfast  . famotidine  20 mg Oral QHS  . insulin aspart  0-15 Units Subcutaneous Q4H  . isosorbide mononitrate  15 mg Oral Daily  . saccharomyces boulardii  250 mg Oral BID   Continuous Infusions: . dextrose 75 mL/hr at 12/17/12 0123    Time spent on care of this patient: 35 mins   [facility] T  [facility] Office  [phone] Pager - Text Page per Loretha Stapler as per below:  On-Call/Text Page:      [email or web address]      password [email or web address]  If 7PM-7AM, please contact night-coverage [email or web address] Password [email or web address] 12/17/2012, 2:12 PM   LOS: 5 days

## 2012-12-18 LAB — COMPREHENSIVE METABOLIC PANEL
ALT: 17 U/L (ref 0–35)
AST: 25 U/L (ref 0–37)
Albumin: 2.8 g/dL — ABNORMAL LOW (ref 3.5–5.2)
CO2: 29 mEq/L (ref 19–32)
Calcium: 8.2 mg/dL — ABNORMAL LOW (ref 8.4–10.5)
Creatinine, Ser: 1.45 mg/dL — ABNORMAL HIGH (ref 0.50–1.10)
GFR calc non Af Amer: 36 mL/min — ABNORMAL LOW (ref >90)
Sodium: 142 mEq/L (ref 135–145)

## 2012-12-18 LAB — GLUCOSE, CAPILLARY
Glucose-Capillary: 136 mg/dL — ABNORMAL HIGH (ref 70–99)
Glucose-Capillary: 163 mg/dL — ABNORMAL HIGH (ref 70–99)
Glucose-Capillary: 187 mg/dL — ABNORMAL HIGH (ref 70–99)
Glucose-Capillary: 156 mg/dL — ABNORMAL HIGH (ref 70–99)

## 2012-12-18 LAB — IRON AND TIBC
Iron: 17 ug/dL — ABNORMAL LOW (ref 42–135)
Saturation Ratios: 8 % — ABNORMAL LOW (ref 20–55)
TIBC: 207 ug/dL — ABNORMAL LOW (ref 250–470)
UIBC: 190 ug/dL (ref 125–400)

## 2012-12-18 LAB — CBC
HCT: 26.9 % — ABNORMAL LOW (ref 36.0–46.0)
Hemoglobin: 8.8 g/dL — ABNORMAL LOW (ref 12.0–15.0)
MCH: 30.2 pg (ref 26.0–34.0)
MCV: 92.4 fL (ref 78.0–100.0)
RBC: 2.91 MIL/uL — ABNORMAL LOW (ref 3.87–5.11)
WBC: 6.4 10*3/uL (ref 4.0–10.5)

## 2012-12-18 LAB — FERRITIN: Ferritin: 69 ng/mL (ref 10–291)

## 2012-12-18 LAB — CULTURE, BLOOD (ROUTINE X 2): Culture: NO GROWTH

## 2012-12-18 LAB — RETICULOCYTES: Retic Ct Pct: 3.6 % — ABNORMAL HIGH (ref 0.4–3.1)

## 2012-12-18 MED ORDER — GLUCERNA SHAKE PO LIQD: 237.0000 mL | ORAL | Status: DC

## 2012-12-18 NOTE — Progress Notes (Signed)
NUTRITION FOLLOW UP  Intervention:   1.  Supplements; Glucerna Shake po daily, each supplement provides 220 kcal and 10 grams of protein. 2.  Nutrition-related labs; recommend obtain HgBA1C  Nutrition Dx:   Altered nutrition-related lab values, ongoing.   New Nutrition Dx:  Inadequate oral intake r/t abdominal pain AEB pt report, diarrhea, PO variable  Monitor:   1.  Food/Beverage; intake sufficient with diet and supplements to meet >/=90% estimated needs 2.  Blood glucose; monitor trends.  Assessment:   Pt admitted with DKA, h/o Type 1 DM, chronic dirrahea, and bilateral BKAs.  Also with h/o dementia.  Admitted with NSTEMI and acute renal failure. Pt with acute encephalopathy.   Pt feels her appetite and intake are improving.  She reports consuming >50% of meals which is significant compared to PTA.  Pt also reports decreased BMs.  She reports several episodes of diarrhea PTA- "as many as anyone can have in a day."  Encouraged intake and offered supplement daily which pt accepts.   Height: Ht Readings from Last 1 Encounters:  12/14/12 5\' 3"  (1.6 m)    Weight Status:   Wt Readings from Last 1 Encounters:  12/18/12 169 lb 8.5 oz (76.9 kg)    Re-estimated needs:  Kcal: 1450-1650 Protein: 60-70g Fluid: 1.5-1.7  Skin: Stage 2 to sacrum, bilateral BKAs  Diet Order: Carb Control   Intake/Output Summary (Last 24 hours) at 12/18/12 1035 Last data filed at 12/18/12 0400  Gross per 24 hour  Intake   1645 ml  Output    150 ml  Net   1495 ml    Last BM: 3/30   Labs:   Recent Labs Lab 12/15/12 0635 12/16/12 0700 12/17/12 0640 12/18/12 0548  NA 146* 143  --  142  K 2.9* 2.9*  --  4.5  CL 108 104  --  105  CO2 27 28  --  29  BUN 19 16  --  12  CREATININE 1.50* 1.51*  --  1.45*  CALCIUM 7.7* 7.4*  --  8.2*  MG 1.1* 1.3* 1.4* 1.5  PHOS 2.2* 2.2* 2.7  --   GLUCOSE 140* 105*  --  173*    CBG (last 3)   Recent Labs  12/17/12 2136 12/18/12 0002 12/18/12 0758   GLUCAP 145* 136* 163*    Scheduled Meds: . aspirin  81 mg Oral Daily  . atorvastatin  80 mg Oral q1800  . carvedilol  6.25 mg Oral BID WC  . clopidogrel  75 mg Oral Q breakfast  . insulin aspart  0-15 Units Subcutaneous TID WC  . isosorbide mononitrate  15 mg Oral Daily  . pantoprazole  40 mg Oral Q1200  . saccharomyces boulardii  250 mg Oral BID    Continuous Infusions: . 0.45 % NaCl with KCl 20 mEq / L 1,000 mL (12/17/12 1804)    Loyce Dys, MS RD LDN Clinical Inpatient Dietitian Pager: [phone] Weekend/After hours pager: [phone]

## 2012-12-18 NOTE — Progress Notes (Signed)
[facility] Progress Note [place] TEAM 1 - Stepdown/ICU TEAM   Lyndal Pulley ZOX:[id number] DOB: 1946-04-17 DOA: 12/12/2012 PCP: Katherina Right, MD  Brief narrative: 67 yo with frequent UTIs / abx use, subacute diarrhea (2 moonth hx of), DM1 reportedly not taking insulin x 24 hours taken to [facility] ED with acute encephalopathy and diagnosed with DKA, acute renal failure, and elevated troponin. Transferred to [facility] for further management on 3/26.    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 Care as per Cardiology - cath deferred due to AKI - to f/u with her Cardiologist in [place], [place]  Encephalopathy acute in setting of dementia Resolved   Acute renal failure in setting of hypovolemia (diarrhea), ACEI, diuretics - overall trend has been of improvement - cont IVF due to poor PO intake  Chronic diarrhea reportedly negative colonoscopy as outpt - reports sx for 2months - Cdiff ordered but pt has not had a BM in >48hrs  - resolved  Anemia Normocytic anemia - will check anemia panel - no evidence of blood loss  Hypocalcemia Calcium normal when corrected with albumin  Hypokalemia resolved  Hypomagnesemia  resolved  Thrombocytopenia No clear etiology at present - avoid heparin products - stopped pepcid- improving  Code Status: FULL Family Communication: spoke at length w/ pt  Disposition Plan: transfer to tele- pt eval  Consultants: Cardiology PCCM  Procedures: none  Antibiotics: none  DVT prophylaxis: SCDs  HPI/Subjective: Pt is alert, sitting in chair- admits to being very weak- does not have 24 hr care at home- eating poorly due to lack of  appetite. No further stools.   Objective: Blood pressure 119/64, pulse 72, temperature 98 F (36.7 C), temperature source Oral, resp. rate 18, height 5\' 3"  (1.6 m), weight 76.9 kg (169 lb 8.5 oz), SpO2 100.00%.  Intake/Output Summary (Last 24 hours) at 12/18/12 1807 Last data filed at 12/18/12 1735  Gross per 24 hour  Intake   1670 ml  Output    600 ml  Net   1070 ml    Exam: General: No acute respiratory distress Lungs: Clear to auscultation bilaterally without wheezes or crackles Cardiovascular: Regular rate and rhythm without murmur gallop or rub normal S1 and S2 Abdomen: Nondistended, mildly tender to palpation diffusely, soft, bowel sounds positive, no rebound, no ascites, no appreciable mass Extremities: No significant cyanosis, clubbing, or edema bilateral lower extremities - s/p B BKA  Data Reviewed: Basic Metabolic Panel:  Recent Labs Lab 12/13/12 0143  12/13/12 1858 12/14/12 0520 12/14/12 1828 12/15/12 0635 12/16/12 0700 12/17/12 0640 12/18/12 0548  NA 140  < > 140  --  142 146* 143  --  142  K 3.3*  < > 3.6  --  3.4* 2.9* 2.9*  --  4.5  CL 114*  < > 112  --  111 108 104  --  105  CO2 14*  < > 14*  --  19 27 28   --  29  GLUCOSE 162*  < > 146*  --  155* 140* 105*  --  173*  BUN 38*  < > 26*  --  20 19 16   --  12  CREATININE 2.96*  < > 2.06*  --  1.49* 1.50* 1.51*  --  1.45*  CALCIUM 7.6*  < > 7.6*  --  7.5* 7.7* 7.4*  --  8.2*  MG 1.9  --   --  1.7  --  1.1* 1.3* 1.4* 1.5  PHOS 3.0  --   --  2.8  --  2.2* 2.2* 2.7  --   < > = values in this interval not displayed. Liver Function Tests:  Recent Labs Lab 12/12/12 0115 12/18/12 0548  AST 28 25  ALT 18 17  ALKPHOS 65 75  BILITOT 0.2* 0.7  PROT 5.4* 5.4*  ALBUMIN 3.0* 2.8*   CBC:  Recent Labs Lab 12/12/12 0115  12/13/12 0143 12/14/12 0520 12/15/12 0635 12/16/12 0700 12/18/12 0548  WBC 8.0  --  7.4 5.3 7.9 8.7 6.4  NEUTROABS 5.3  --   --   --   --   --   --   HGB 10.0*  < > 9.5* 10.1* 9.9* 9.0*  8.8*  HCT 29.5*  < > 27.9* 28.9* 28.8* 26.6* 26.9*  MCV 89.4  --  88.3 87.6 87.8 89.6 92.4  PLT 79*  --  66* 68* 63* 55* 70*  < > = values in this interval not displayed. Cardiac Enzymes:  Recent Labs Lab 12/12/12 0230 12/12/12 0809 12/12/12 1315 12/12/12 2126  TROPONINI 4.45* 7.72* 13.73* 11.04*   CBG:  Recent Labs Lab 12/17/12 1653 12/17/12 2136 12/18/12 0002 12/18/12 0758 12/18/12 1212  GLUCAP 262* 145* 136* 163* 187*    Recent Results (from the past 240 hour(s))  MRSA PCR SCREENING     Status: None   Collection Time    12/12/12  1:44 AM      Result Value Range Status   MRSA by PCR NEGATIVE  NEGATIVE Final   Comment:            The GeneXpert MRSA Assay (FDA     approved for NASAL specimens     only), is one component of a     comprehensive MRSA colonization     surveillance program. It is not     intended to diagnose MRSA     infection nor to guide or     monitor treatment for     MRSA infections.  CULTURE, BLOOD (ROUTINE X 2)     Status: None   Collection Time    12/12/12  3:25 AM      Result Value Range Status   Specimen Description BLOOD LEFT HAND   Final   Special Requests BOTTLES DRAWN AEROBIC ONLY 2CC   Final   Culture  Setup Time 12/12/2012 08:53   Final   Culture NO GROWTH 5 DAYS   Final   Report Status 12/18/2012 FINAL   Final  CULTURE, BLOOD (ROUTINE X 2)     Status: None   Collection Time    12/12/12  3:30 AM      Result Value Range Status   Specimen Description BLOOD RIGHT HAND   Final   Special Requests BOTTLES DRAWN AEROBIC ONLY [facility]   Final   Culture  Setup Time 12/12/2012 08:52   Final   Culture NO GROWTH 5 DAYS   Final   Report Status 12/18/2012 FINAL   Final  URINE CULTURE     Status: None   Collection Time    12/12/12  5:47 AM      Result Value Range Status   Specimen Description  URINE, CLEAN CATCH   Final   Special Requests NONE   Final   Culture  Setup Time 12/12/2012 05:47   Final   Colony Count 60,000 COLONIES/ML   Final    Culture     Final   Value: STAPHYLOCOCCUS SPECIES (COAGULASE NEGATIVE)     Note: RIFAMPIN AND GENTAMICIN SHOULD NOT BE USED AS SINGLE DRUGS FOR TREATMENT OF STAPH INFECTIONS.     ENTEROCOCCUS SPECIES   Report Status 12/14/2012 FINAL   Final   Organism ID, Bacteria STAPHYLOCOCCUS SPECIES (COAGULASE NEGATIVE)   Final   Organism ID, Bacteria ENTEROCOCCUS SPECIES   Final     Studies:  Recent x-ray studies have been reviewed in detail by the Attending Physician  Scheduled Meds:  Scheduled Meds: . aspirin  81 mg Oral Daily  . atorvastatin  80 mg Oral q1800  . carvedilol  6.25 mg Oral BID WC  . clopidogrel  75 mg Oral Q breakfast  . feeding supplement  237 mL Oral Q24H  . insulin aspart  0-15 Units Subcutaneous TID WC  . isosorbide mononitrate  15 mg Oral Daily  . pantoprazole  40 mg Oral Q1200  . saccharomyces boulardii  250 mg Oral BID   Continuous Infusions: . 0.45 % NaCl with KCl 20 mEq / L 50 mL/hr at 12/18/12 1740    Time spent on care of this patient: 35 mins   [facility]  [facility] Office  [phone] Pager - Text Page per Loretha Stapler as per below:  On-Call/Text Page:      [email or web address]      password [email or web address]  If 7PM-7AM, please contact night-coverage [email or web address] Password [email or web address] 12/18/2012, 6:07 PM   LOS: 6 days

## 2012-12-19 ENCOUNTER — Encounter (HOSPITAL_COMMUNITY): Payer: Self-pay | Admitting: Internal Medicine

## 2012-12-19 DIAGNOSIS — I2589 Other forms of chronic ischemic heart disease: Secondary | ICD-10-CM

## 2012-12-19 DIAGNOSIS — E109 Type 1 diabetes mellitus without complications: Secondary | ICD-10-CM | POA: Diagnosis present

## 2012-12-19 DIAGNOSIS — F418 Other specified anxiety disorders: Secondary | ICD-10-CM | POA: Diagnosis present

## 2012-12-19 DIAGNOSIS — I255 Ischemic cardiomyopathy: Secondary | ICD-10-CM | POA: Diagnosis present

## 2012-12-19 DIAGNOSIS — B952 Enterococcus as the cause of diseases classified elsewhere: Secondary | ICD-10-CM

## 2012-12-19 DIAGNOSIS — N390 Urinary tract infection, site not specified: Secondary | ICD-10-CM

## 2012-12-19 HISTORY — DX: Urinary tract infection, site not specified: N39.0

## 2012-12-19 HISTORY — DX: Urinary tract infection, site not specified: B95.2

## 2012-12-19 LAB — GLUCOSE, CAPILLARY
Glucose-Capillary: 245 mg/dL — ABNORMAL HIGH (ref 70–99)
Glucose-Capillary: 314 mg/dL — ABNORMAL HIGH (ref 70–99)

## 2012-12-19 LAB — CBC
MCHC: 32.6 g/dL (ref 30.0–36.0)
MCV: 92.7 fL (ref 78.0–100.0)
Platelets: 83 10*3/uL — ABNORMAL LOW (ref 150–400)
RDW: 15.8 % — ABNORMAL HIGH (ref 11.5–15.5)
WBC: 5.7 10*3/uL (ref 4.0–10.5)

## 2012-12-19 LAB — BASIC METABOLIC PANEL
BUN: 16 mg/dL (ref 6–23)
CO2: 26 mEq/L (ref 19–32)
Calcium: 8.1 mg/dL — ABNORMAL LOW (ref 8.4–10.5)
Creatinine, Ser: 1.65 mg/dL — ABNORMAL HIGH (ref 0.50–1.10)

## 2012-12-19 LAB — URINE CULTURE: Colony Count: >=100000

## 2012-12-19 MED ORDER — INSULIN GLARGINE 100 UNIT/ML ~~LOC~~ SOLN
12.0000 [IU] | Freq: Two times a day (BID) | SUBCUTANEOUS | Status: DC
  Administered 2012-12-19 – 2012-12-21 (×4): 12 [IU] via SUBCUTANEOUS
  Filled 2012-12-19 (×5): qty 0.12

## 2012-12-19 MED ORDER — TRAZODONE HCL 50 MG PO TABS
50.0000 mg | ORAL_TABLET | Freq: Every evening | ORAL | Status: DC | PRN
  Administered 2012-12-19 – 2012-12-20 (×2): 50 mg via ORAL
  Filled 2012-12-19 (×2): qty 1

## 2012-12-19 MED ORDER — CITALOPRAM HYDROBROMIDE 20 MG PO TABS
20.0000 mg | ORAL_TABLET | Freq: Every day | ORAL | Status: DC
  Administered 2012-12-19 – 2012-12-21 (×3): 20 mg via ORAL
  Filled 2012-12-19 (×3): qty 1

## 2012-12-19 MED ORDER — VANCOMYCIN HCL IN DEXTROSE 1-5 GM/200ML-% IV SOLN
1000.0000 mg | INTRAVENOUS | Status: DC
  Administered 2012-12-19 – 2012-12-20 (×2): 1000 mg via INTRAVENOUS
  Filled 2012-12-19 (×3): qty 200

## 2012-12-19 MED ORDER — ALPRAZOLAM 0.25 MG PO TABS
0.2500 mg | ORAL_TABLET | Freq: Two times a day (BID) | ORAL | Status: DC | PRN
  Administered 2012-12-19 – 2012-12-20 (×2): 0.25 mg via ORAL
  Filled 2012-12-19 (×2): qty 1

## 2012-12-19 MED ORDER — SENNOSIDES-DOCUSATE SODIUM 8.6-50 MG PO TABS
1.0000 | ORAL_TABLET | Freq: Every day | ORAL | Status: DC
  Administered 2012-12-20: 1 via ORAL
  Filled 2012-12-19 (×3): qty 1

## 2012-12-19 NOTE — Progress Notes (Signed)
Inpatient Diabetes Program Recommendations  AACE/ADA: New Consensus Statement on Inpatient Glycemic Control (2013)  Target Ranges:  Prepandial:   less than 140 mg/dL      Peak postprandial:   less than 180 mg/dL (1-2 hours)      Critically ill patients:  140 - 180 mg/dL  Results for Christine Hughes, Christine Hughes (MRN [id number]) as of 12/19/2012 10:27  Ref. Range 12/18/2012 07:58 12/18/2012 12:12 12/18/2012 15:24 12/18/2012 21:12 12/19/2012 06:30  Glucose-Capillary Latest Range: 70-99 mg/dL 409 (H) 811 (H) 914 (H) 156 (H) 271 (H)   Inpatient Diabetes Program Recommendations Insulin - Basal: start low dose basal insulin 5-10 units Thank you  Piedad Climes BSN, RN,CDE Inpatient Diabetes Coordinator [phone] (team pager)

## 2012-12-19 NOTE — Progress Notes (Signed)
Subjective: Patient is sitting up in a chair. She complains of mild nausea and abdominal cramping, but no vomiting.  Objective: Vital signs in last 24 hours: Filed Vitals:   12/18/12 2026 12/19/12 0511 12/19/12 0515 12/19/12 1330  BP: 111/50 123/61  133/66  Pulse: 81 84  83  Temp: 98.5 F (36.9 C) 98.1 F (36.7 C)  98.3 F (36.8 C)  TempSrc: Oral Oral  Oral  Resp: 18 18  18   Height:      Weight:  78.3 kg (172 lb 9.9 oz) 77 kg (169 lb 12.1 oz)   SpO2: 100% 100%  99%    Intake/Output Summary (Last 24 hours) at 12/19/12 1558 Last data filed at 12/19/12 1414  Gross per 24 hour  Intake   1490 ml  Output   1650 ml  Net   -160 ml    Weight change: 0.8 kg (1 lb 12.2 oz)  Physical exam: General: Debilitated-appearing 67 year old Caucasian woman sitting up in a chair, in no acute distress.  Lungs: Decreased breath sounds in the bases, otherwise clear anteriorly. Heart: S1, S2, with a soft systolic murmur. Abdomen: Positive bowel sounds, soft, mildly tender over the hypogastrium and suprapubic area. No guarding, no masses palpated. Extremities: No pedal edema. Neurologic: She is alert and oriented x2. Cranial nerves II through XII are grossly intact.  Lab Results: Basic Metabolic Panel:  Recent Labs  47/82/95 0640 12/18/12 0548 12/19/12 0550  NA  --  142 141  K  --  4.5 4.1  CL  --  105 105  CO2  --  29 26  GLUCOSE  --  173* 300*  BUN  --  12 16  CREATININE  --  1.45* 1.65*  CALCIUM  --  8.2* 8.1*  MG 1.4* 1.5  --   PHOS 2.7  --   --    Liver Function Tests:  Recent Labs  12/18/12 0548  AST 25  ALT 17  ALKPHOS 75  BILITOT 0.7  PROT 5.4*  ALBUMIN 2.8*   No results found for this basename: LIPASE, AMYLASE,  in the last 72 hours No results found for this basename: AMMONIA,  in the last 72 hours CBC:  Recent Labs  12/18/12 0548 12/19/12 0550  WBC 6.4 5.7  HGB 8.8* 8.7*  HCT 26.9* 26.7*  MCV 92.4 92.7  PLT 70* 83*   Cardiac Enzymes: No results found  for this basename: CKTOTAL, CKMB, CKMBINDEX, TROPONINI,  in the last 72 hours BNP: No results found for this basename: PROBNP,  in the last 72 hours D-Dimer: No results found for this basename: DDIMER,  in the last 72 hours CBG:  Recent Labs  12/18/12 0758 12/18/12 1212 12/18/12 1524 12/18/12 2112 12/19/12 0630 12/19/12 1124  GLUCAP 163* 187* 297* 156* 271* 245*   Hemoglobin A1C: No results found for this basename: HGBA1C,  in the last 72 hours Fasting Lipid Panel: No results found for this basename: CHOL, HDL, LDLCALC, TRIG, CHOLHDL, LDLDIRECT,  in the last 72 hours Thyroid Function Tests: No results found for this basename: TSH, T4TOTAL, FREET4, T3FREE, THYROIDAB,  in the last 72 hours Anemia Panel:  Recent Labs  12/18/12 0548  VITAMINB12 439  FOLATE 8.0  FERRITIN 69  TIBC 207*  IRON 17*  RETICCTPCT 3.6*   Coagulation: No results found for this basename: LABPROT, INR,  in the last 72 hours Urine Drug Screen: Drugs of Abuse  No results found for this basename: labopia,  cocainscrnur,  labbenz,  amphetmu,  thcu,  labbarb    Alcohol Level: No results found for this basename: ETH,  in the last 72 hours Urinalysis:  Recent Labs  12/17/12 1951  COLORURINE YELLOW  LABSPEC 1.015  PHURINE 7.0  GLUCOSEU >1000*  HGBUR TRACE*  BILIRUBINUR NEGATIVE  KETONESUR NEGATIVE  PROTEINUR NEGATIVE  UROBILINOGEN 0.2  NITRITE NEGATIVE  LEUKOCYTESUR LARGE*   Misc. Labs:   Micro: Recent Results (from the past 240 hour(s))  MRSA PCR SCREENING     Status: None   Collection Time    12/12/12  1:44 AM      Result Value Range Status   MRSA by PCR NEGATIVE  NEGATIVE Final   Comment:            The GeneXpert MRSA Assay (FDA     approved for NASAL specimens     only), is one component of a     comprehensive MRSA colonization     surveillance program. It is not     intended to diagnose MRSA     infection nor to guide or     monitor treatment for     MRSA infections.   CULTURE, BLOOD (ROUTINE X 2)     Status: None   Collection Time    12/12/12  3:25 AM      Result Value Range Status   Specimen Description BLOOD LEFT HAND   Final   Special Requests BOTTLES DRAWN AEROBIC ONLY 2CC   Final   Culture  Setup Time 12/12/2012 08:53   Final   Culture NO GROWTH 5 DAYS   Final   Report Status 12/18/2012 FINAL   Final  CULTURE, BLOOD (ROUTINE X 2)     Status: None   Collection Time    12/12/12  3:30 AM      Result Value Range Status   Specimen Description BLOOD RIGHT HAND   Final   Special Requests BOTTLES DRAWN AEROBIC ONLY 2CC   Final   Culture  Setup Time 12/12/2012 08:52   Final   Culture NO GROWTH 5 DAYS   Final   Report Status 12/18/2012 FINAL   Final  URINE CULTURE     Status: None   Collection Time    12/12/12  5:47 AM      Result Value Range Status   Specimen Description URINE, CLEAN CATCH   Final   Special Requests NONE   Final   Culture  Setup Time 12/12/2012 05:47   Final   Colony Count 60,000 COLONIES/ML   Final   Culture     Final   Value: STAPHYLOCOCCUS SPECIES (COAGULASE NEGATIVE)     Note: RIFAMPIN AND GENTAMICIN SHOULD NOT BE USED AS SINGLE DRUGS FOR TREATMENT OF STAPH INFECTIONS.     ENTEROCOCCUS SPECIES   Report Status 12/14/2012 FINAL   Final   Organism ID, Bacteria STAPHYLOCOCCUS SPECIES (COAGULASE NEGATIVE)   Final   Organism ID, Bacteria ENTEROCOCCUS SPECIES   Final  URINE CULTURE     Status: None   Collection Time    12/17/12  7:51 PM      Result Value Range Status   Specimen Description URINE, RANDOM   Final   Special Requests NONE   Final   Culture  Setup Time 12/17/2012 20:28   Final   Colony Count >=100,000 COLONIES/ML   Final   Culture ENTEROCOCCUS SPECIES   Final   Report Status 12/19/2012 FINAL   Final   Organism ID, Bacteria ENTEROCOCCUS SPECIES   Final  Studies/Results: No results found.  Medications:  Scheduled: . aspirin  81 mg Oral Daily  . atorvastatin  80 mg Oral q1800  . carvedilol  6.25 mg Oral  BID WC  . clopidogrel  75 mg Oral Q breakfast  . feeding supplement  237 mL Oral Q24H  . insulin aspart  0-15 Units Subcutaneous TID WC  . isosorbide mononitrate  15 mg Oral Daily  . pantoprazole  40 mg Oral Q1200  . saccharomyces boulardii  250 mg Oral BID   Continuous: . 0.45 % NaCl with KCl 20 mEq / L 50 mL/hr at 12/18/12 1740   GNF:AOZHYQMVHQION, fentaNYL, ondansetron  Assessment: Active Problems:   DKA (diabetic ketoacidoses)   NSTEMI (non-ST elevated myocardial infarction)   Encephalopathy acute   Acute renal failure   Anemia   Hypocalcemia   Hypomagnesemia   Thrombocytopenia   Enterococcus UTI   Cardiomyopathy, ischemic    1.Type 1 diabetes mellitus. Generally uncontrolled. Will adjust insulin accordingly. Diabetes coordinator recommendations noted and appreciated. We'll order a hemoglobin A1c.  Metabolic acidosis secondary to DKA, starvation, chronic diarrhea, and acute kidney injury. Acidosis has resolved.  Cardiomyopathy with ejection fraction of approximately 45% with moderate hypokinesis of the inferior walls. Query ischemic cardiomyopathy.  Non-ST elevation myocardial infarction with a history of CAD and history of CABG. She has no complaints of chest pain. Continue medical therapy as recommended by cardiology. Cardiac catheterization not performed due to the patient's renal function. She will followup with her cardiologist in [place]. Of note, 2-D echocardiogram 12/12/2012 was severely technically limited, however there was cardiomyopathy with ejection fraction of around 45%, per cardiology.  Acute encephalopathy in the setting of dementia and metabolic derangements. Resolved.  Acute renal injury/acute renal failure. Secondary to prerenal azotemia in the setting of ACE inhibitor therapy and diuretics. Her creatinine was 4.84 on admission. Her renal function has improved significantly. We'll continue gentle IV fluids.   Anemia and thrombocytopenia. Anemia panel  noted for normal vitamin B12 and folate. Total iron is low at 17. Anemia is likely secondary to iron deficiency, acute and chronic disease, hemodilution, venipunctures, and acute renal failure. The thrombocytopenia may be in part due to acute and chronic illness and underlying infection (UTI).  Electrolyte abnormalities including hypomagnesemia, hypocalcemia, hypokalemia, et Karie Soda. Serum potassium and magnesium levels have improved. Calcium level is low, but corrects with albumin level.  Enterococcus urinary tract infection. We'll start IV vancomycin.  Physical deconditioning. PT recommended skilled nursing facility placement short-term.    Plan:  1. Will start vancomycin IV. If the patient can tolerate Levaquin, will switch to oral Levaquin. 2. Start discharge planning. 3. Will followup laboratory studies in the morning. Also we'll order hemoglobin A1c. 4. Adjust insulin for better control of blood sugars.   LOS: 7 days   Christine Hughes 12/19/2012, 3:58 PM

## 2012-12-19 NOTE — Progress Notes (Signed)
ANTIBIOTIC CONSULT NOTE - INITIAL  Pharmacy Consult for Vancomycin Indication: Enterococcus  Allergies  Allergen Reactions  . Ciprofloxacin Hcl   . Nitrofuran Derivatives   . Penicillins   . Quetiapine Fumarate   . Sulfa Antibiotics    Patient Measurements: Height: 5\' 3"  (160 cm) Weight: 169 lb 12.1 oz (77 kg) IBW/kg (Calculated) : 52.4  Vital Signs: Temp: 98.3 F (36.8 C) (04/02 1330) Temp src: Oral (04/02 1330) BP: 133/66 mmHg (04/02 1330) Pulse Rate: 83 (04/02 1330) Intake/Output from previous day: 04/01 0701 - 04/02 0700 In: 2250 [P.O.:1200; I.V.:1050] Out: 850 [Urine:850] Intake/Output from this shift: Total I/O In: 240 [P.O.:240] Out: 800 [Urine:800]  Labs:  Recent Labs  12/18/12 0548 12/19/12 0550  WBC 6.4 5.7  HGB 8.8* 8.7*  PLT 70* 83*  CREATININE 1.45* 1.65*   Estimated Creatinine Clearance: 32 ml/min (by C-G formula based on Cr of 1.65).  Microbiology: Recent Results (from the past 720 hour(s))  MRSA PCR SCREENING     Status: None   Collection Time    12/12/12  1:44 AM      Result Value Range Status   MRSA by PCR NEGATIVE  NEGATIVE Final   Comment:            The GeneXpert MRSA Assay (FDA     approved for NASAL specimens     only), is one component of a     comprehensive MRSA colonization     surveillance program. It is not     intended to diagnose MRSA     infection nor to guide or     monitor treatment for     MRSA infections.  CULTURE, BLOOD (ROUTINE X 2)     Status: None   Collection Time    12/12/12  3:25 AM      Result Value Range Status   Specimen Description BLOOD LEFT HAND   Final   Special Requests BOTTLES DRAWN AEROBIC ONLY 2CC   Final   Culture  Setup Time 12/12/2012 08:53   Final   Culture NO GROWTH 5 DAYS   Final   Report Status 12/18/2012 FINAL   Final  CULTURE, BLOOD (ROUTINE X 2)     Status: None   Collection Time    12/12/12  3:30 AM      Result Value Range Status   Specimen Description BLOOD RIGHT HAND   Final    Special Requests BOTTLES DRAWN AEROBIC ONLY 2CC   Final   Culture  Setup Time 12/12/2012 08:52   Final   Culture NO GROWTH 5 DAYS   Final   Report Status 12/18/2012 FINAL   Final  URINE CULTURE     Status: None   Collection Time    12/12/12  5:47 AM      Result Value Range Status   Specimen Description URINE, CLEAN CATCH   Final   Special Requests NONE   Final   Culture  Setup Time 12/12/2012 05:47   Final   Colony Count 60,000 COLONIES/ML   Final   Culture     Final   Value: STAPHYLOCOCCUS SPECIES (COAGULASE NEGATIVE)     Note: RIFAMPIN AND GENTAMICIN SHOULD NOT BE USED AS SINGLE DRUGS FOR TREATMENT OF STAPH INFECTIONS.     ENTEROCOCCUS SPECIES   Report Status 12/14/2012 FINAL   Final   Organism ID, Bacteria STAPHYLOCOCCUS SPECIES (COAGULASE NEGATIVE)   Final   Organism ID, Bacteria ENTEROCOCCUS SPECIES   Final  URINE CULTURE  Status: None   Collection Time    12/17/12  7:51 PM      Result Value Range Status   Specimen Description URINE, RANDOM   Final   Special Requests NONE   Final   Culture  Setup Time 12/17/2012 20:28   Final   Colony Count >=100,000 COLONIES/ML   Final   Culture ENTEROCOCCUS SPECIES   Final   Report Status 12/19/2012 FINAL   Final   Organism ID, Bacteria ENTEROCOCCUS SPECIES   Final   Medical History: Past Medical History  Diagnosis Date  . Coronary artery disease     a. s/p prior MI;  b. CABG 1999 @ [facility]; c. s/p stenting 2009 or 10.  Marland Kitchen Hypertension   . Anxiety   . Depression   . Dementia   . Diabetes mellitus with complication     a. 11/2012 DKA  . GERD (gastroesophageal reflux disease)     Bilateral BKAs  . H/O hiatal hernia   . Headache   . Arthritis   . Anemia   . Cancer     L breast cancer  . Pulmonary embolism     a. prev coumadin  . Enterococcus UTI 12/19/2012  . Cardiomyopathy, ischemic 12/19/2012   Assessment: 67 yo female with frequent UTI's and now with Enterococcus in the urine.  She was receiving Doxycycline prior to admit.   She has had some renal dysfunction with a creatinine as high as 4.84 but is 1.65 today with an estimated clearance of ~ 32 ml/min.   She has had fairly good UOP today at 0.5 ml/kg/hr.  She has some thrombocytopenia with platelets of 83K.   She is on 2L [place], afebrile and her WBC are wnl.  We have been asked to start IV Vancomycin for her Enterococcus with plans to change to oral Levofloxacin as tolerated.  Goal of Therapy:  Vancomycin Trough 15-20 mcg/ml  Plan:  1.  Begin Vancomycin 1000 mg IV every 24 hours. 2.  F/u renal function 3.  Obtain s/s levels if continued > 72hr.  Nadara Mustard, PharmD., MS Clinical Pharmacist Pager:  [phone] Thank you for allowing pharmacy to be part of this patients care team. 12/19/2012,4:28 PM

## 2012-12-19 NOTE — Evaluation (Signed)
Physical Therapy Evaluation Patient Details Name: Christine Hughes MRN: [id number] DOB: 11-Sep-1946 Today's Date: 12/19/2012 Time: 4098-1191 PT Time Calculation (min): 27 min  PT Assessment / Plan / Recommendation Clinical Impression  Pt s/p DKA, NSTEMI and encephalopathy.  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  Pt c/o nausea as well.  Needs continued PT to address mobility issues.  Has difficulty with endurance and balance.  Feel she will need post acute rehab prior to d/c home alone to gain strength and will most likely be best to be done at [facility].      PT Assessment  Patient needs continued PT services    Follow Up Recommendations  SNF;Supervision/Assistance - 24 hour        Barriers to Discharge Decreased caregiver support      Equipment Recommendations  None recommended by PT         Frequency Min 3X/week    Precautions / Restrictions Precautions Precautions: Fall Required Braces or Orthoses: Other Brace/Splint Other Brace/Splint: Bil LE prostheses Restrictions Weight Bearing Restrictions: No   Pertinent Vitals/Pain VSS, No pain      Mobility  Bed Mobility Bed Mobility: Rolling Right;Right Sidelying to Sit;Sitting - Scoot to Edge of Bed Rolling Right: 4: Min assist Right Sidelying to Sit: 4: Min assist Sitting - Scoot to Edge of Bed: 4: Min assist Details for Bed Mobility Assistance: needed a little assist for elevation of trunk.  Pt placed bil LE prostheses on without assist.  Took incr time to get them on. Transfers Transfers: Sit to Stand;Stand to [employer] Transfers Sit to Stand: 4: Min assist;With upper extremity assist;From bed Stand to Sit: 4: Min assist;With upper extremity assist;To chair/3-in-1;With armrests Stand Pivot Transfers: 4: Min assist Details for Transfer Assistance: Needed steadying asssist for sit to stand and for stand pivot transfer.  Pt needed a little assist weight shifting.   Ambulation/Gait Ambulation/Gait Assistance: Not tested (comment) Stairs:  No Wheelchair Mobility Wheelchair Mobility: No         PT Diagnosis: Generalized weakness  PT Problem List: Decreased activity tolerance;Decreased balance;Decreased strength;Decreased mobility;Decreased knowledge of use of DME;Decreased safety awareness PT Treatment Interventions: DME instruction;Functional mobility training;Therapeutic activities;Therapeutic exercise;Balance training;Patient/family education;Wheelchair mobility training   PT Goals Acute Rehab PT Goals PT Goal Formulation: With patient Time For Goal Achievement: 01/02/13 Potential to Achieve Goals: Good Pt will go Supine/Side to Sit: Independently PT Goal: Supine/Side to Sit - Progress: Goal set today Pt will go Sit to Stand: Independently PT Goal: Sit to Stand - Progress: Goal set today Pt will Transfer Bed to Chair/Chair to Bed: with modified independence PT Transfer Goal: Bed to Chair/Chair to Bed - Progress: Goal set today Pt will Propel Wheelchair: 51 - 150 feet;with modified independence PT Goal: Propel Wheelchair - Progress: Goal set today  Visit Information  Last PT Received On: 12/19/12 Assistance Needed: +1    Subjective Data  Subjective: "I feel so tired and weak." Patient Stated Goal: Desires therapy prior to return home   Prior Functioning  Home Living Lives With: Alone Available Help at Discharge: Other (Comment);Personal care attendant (3 hours day, 5 days a week) Type of Home: House Home Access: Level entry Home Layout: One level Bathroom Shower/Tub: Health visitor: Standard Home Adaptive Equipment: Shower chair with back;Hand-held shower hose;Grab bars in shower;Grab bars around toilet;Walker - rolling;Wheelchair - manual;Wheelchair - powered Additional Comments: uses manual wheelchair around home.  Has bil prostheses Prior Function Level of Independence: Independent with assistive device(s) Able to Take Stairs?: No Driving:  No Vocation: On disability Comments: Daughter  gets groceries and takes to appointments Communication Communication: No difficulties Dominant Hand: Right    Cognition  Cognition Overall Cognitive Status: Appears within functional limits for tasks assessed/performed Arousal/Alertness: Awake/alert Orientation Level: Appears intact for tasks assessed Behavior During Session: [facility] for tasks performed    Extremity/Trunk Assessment Right Lower Extremity Assessment RLE ROM/Strength/Tone: [facility] for tasks assessed;Deficits RLE ROM/Strength/Tone Deficits: grossly 4-/5 hip and knee Left Lower Extremity Assessment LLE ROM/Strength/Tone: [facility] for tasks assessed;Deficits LLE ROM/Strength/Tone Deficits: grossly 4-/5 hip and knee   Balance Static Standing Balance Static Standing - Balance Support: Bilateral upper extremity supported;During functional activity Static Standing - Level of Assistance: 4: Min assist Static Standing - Comment/# of Minutes: Needed steadying assist for static stance.  Weakness bil LEs.    End of Session PT - End of Session Equipment Utilized During Treatment: Gait belt;Oxygen Activity Tolerance: Patient limited by fatigue;Other (comment) (Limited by nausea) Patient left: in chair;with call bell/phone within reach Nurse Communication: Mobility status       INGOLD,Manu Rubey 12/19/2012, 10:17 AM [facility] [phone] (pager)

## 2012-12-20 LAB — GLUCOSE, CAPILLARY
Glucose-Capillary: 235 mg/dL — ABNORMAL HIGH (ref 70–99)
Glucose-Capillary: 161 mg/dL — ABNORMAL HIGH (ref 70–99)

## 2012-12-20 LAB — BASIC METABOLIC PANEL
BUN: 16 mg/dL (ref 6–23)
CO2: 29 mEq/L (ref 19–32)
Calcium: 8 mg/dL — ABNORMAL LOW (ref 8.4–10.5)
Chloride: 107 mEq/L (ref 96–112)
GFR calc Af Amer: 38 mL/min — ABNORMAL LOW (ref >90)
GFR calc non Af Amer: 33 mL/min — ABNORMAL LOW (ref >90)
Glucose, Bld: 272 mg/dL — ABNORMAL HIGH (ref 70–99)
Glucose, Bld: 272 mg/dL — ABNORMAL HIGH (ref 70–99)
Potassium: 5.6 mEq/L — ABNORMAL HIGH (ref 3.5–5.1)
Potassium: 4 mEq/L (ref 3.5–5.1)
Sodium: 142 mEq/L (ref 135–145)
Sodium: 138 mEq/L (ref 135–145)

## 2012-12-20 LAB — MAGNESIUM: Magnesium: 1.2 mg/dL — ABNORMAL LOW (ref 1.5–2.5)

## 2012-12-20 MED ORDER — INSULIN ASPART 100 UNIT/ML ~~LOC~~ SOLN
0.0000 [IU] | Freq: Three times a day (TID) | SUBCUTANEOUS | Status: DC
  Administered 2012-12-20: 7 [IU] via SUBCUTANEOUS
  Administered 2012-12-20: 11 [IU] via SUBCUTANEOUS
  Administered 2012-12-21: 7 [IU] via SUBCUTANEOUS
  Administered 2012-12-21: 11 [IU] via SUBCUTANEOUS

## 2012-12-20 MED ORDER — INSULIN ASPART 100 UNIT/ML ~~LOC~~ SOLN: 0.0000 [IU] | Freq: Every day | SUBCUTANEOUS | Status: DC

## 2012-12-20 MED ORDER — SODIUM CHLORIDE 0.9 % IV SOLN
INTRAVENOUS | Status: DC
  Administered 2012-12-20: 12:00:00 via INTRAVENOUS

## 2012-12-20 MED ORDER — INSULIN ASPART 100 UNIT/ML ~~LOC~~ SOLN
3.0000 [IU] | Freq: Three times a day (TID) | SUBCUTANEOUS | Status: DC
  Administered 2012-12-21 (×2): 3 [IU] via SUBCUTANEOUS

## 2012-12-20 NOTE — Progress Notes (Signed)
Pt has healed staged II to sacrum. Pink granulation tissue noted. No open wound no drainage. Placed new restore dressing.

## 2012-12-20 NOTE — Progress Notes (Signed)
Subjective: Patient is sitting up in a chair. She has no complaints.  Objective: Vital signs in last 24 hours: Filed Vitals:   12/19/12 1330 12/19/12 2106 12/20/12 0517 12/20/12 1335  BP: 133/66 119/64 124/67 100/42  Pulse: 83 81 85 83  Temp: 98.3 F (36.8 C) 98.3 F (36.8 C) 98 F (36.7 C) 98.3 F (36.8 C)  TempSrc: Oral Oral Oral Oral  Resp: 18 18 20 18   Height:      Weight:   79.7 kg (175 lb 11.3 oz)   SpO2: 99% 96% 96% 97%    Intake/Output Summary (Last 24 hours) at 12/20/12 1521 Last data filed at 12/20/12 1414  Gross per 24 hour  Intake    720 ml  Output   1600 ml  Net   -880 ml    Weight change: 1.4 kg (3 lb 1.4 oz)  Physical exam: General: Debilitated-appearing 67 year old Caucasian woman sitting up in bed, in no acute distress.  Lungs: Decreased breath sounds in the bases, otherwise clear anteriorly. Heart: S1, S2, with a soft systolic murmur. Abdomen: Positive bowel sounds, soft, mildly tender over the hypogastrium and suprapubic area. No guarding, no masses palpated. Extremities: No pedal edema. Neurologic: She is alert and oriented x2. Cranial nerves II through XII are grossly intact.  Lab Results: Basic Metabolic Panel:  Recent Labs  16/10/96 0548  12/20/12 0500 12/20/12 0949  NA 142  < > 142 138  K 4.5  < > 5.6* 4.0  CL 105  < > 107 103  CO2 29  < > 29 28  GLUCOSE 173*  < > 272* 272*  BUN 12  < > 17 16  CREATININE 1.45*  < > 1.72* 1.56*  CALCIUM 8.2*  < > 8.3* 8.0*  MG 1.5  --  1.2*  --   < > = values in this interval not displayed. Liver Function Tests:  Recent Labs  12/18/12 0548  AST 25  ALT 17  ALKPHOS 75  BILITOT 0.7  PROT 5.4*  ALBUMIN 2.8*   No results found for this basename: LIPASE, AMYLASE,  in the last 72 hours No results found for this basename: AMMONIA,  in the last 72 hours CBC:  Recent Labs  12/18/12 0548 12/19/12 0550  WBC 6.4 5.7  HGB 8.8* 8.7*  HCT 26.9* 26.7*  MCV 92.4 92.7  PLT 70* 83*   Cardiac  Enzymes: No results found for this basename: CKTOTAL, CKMB, CKMBINDEX, TROPONINI,  in the last 72 hours BNP: No results found for this basename: PROBNP,  in the last 72 hours D-Dimer: No results found for this basename: DDIMER,  in the last 72 hours CBG:  Recent Labs  12/19/12 0630 12/19/12 1124 12/19/12 1635 12/19/12 2104 12/20/12 0621 12/20/12 1133  GLUCAP 271* 245* 235* 314* 269* 253*   Hemoglobin A1C:  Recent Labs  12/19/12 1616  HGBA1C 9.0*   Fasting Lipid Panel: No results found for this basename: CHOL, HDL, LDLCALC, TRIG, CHOLHDL, LDLDIRECT,  in the last 72 hours Thyroid Function Tests: No results found for this basename: TSH, T4TOTAL, FREET4, T3FREE, THYROIDAB,  in the last 72 hours Anemia Panel:  Recent Labs  12/18/12 0548  VITAMINB12 439  FOLATE 8.0  FERRITIN 69  TIBC 207*  IRON 17*  RETICCTPCT 3.6*   Coagulation: No results found for this basename: LABPROT, INR,  in the last 72 hours Urine Drug Screen: Drugs of Abuse  No results found for this basename: labopia,  cocainscrnur,  labbenz,  amphetmu,  thcu,  labbarb    Alcohol Level: No results found for this basename: ETH,  in the last 72 hours Urinalysis:  Recent Labs  12/17/12 1951  COLORURINE YELLOW  LABSPEC 1.015  PHURINE 7.0  GLUCOSEU >1000*  HGBUR TRACE*  BILIRUBINUR NEGATIVE  KETONESUR NEGATIVE  PROTEINUR NEGATIVE  UROBILINOGEN 0.2  NITRITE NEGATIVE  LEUKOCYTESUR LARGE*   Misc. Labs:   Micro: Recent Results (from the past 240 hour(s))  MRSA PCR SCREENING     Status: None   Collection Time    12/12/12  1:44 AM      Result Value Range Status   MRSA by PCR NEGATIVE  NEGATIVE Final   Comment:            The GeneXpert MRSA Assay (FDA     approved for NASAL specimens     only), is one component of a     comprehensive MRSA colonization     surveillance program. It is not     intended to diagnose MRSA     infection nor to guide or     monitor treatment for     MRSA  infections.  CULTURE, BLOOD (ROUTINE X 2)     Status: None   Collection Time    12/12/12  3:25 AM      Result Value Range Status   Specimen Description BLOOD LEFT HAND   Final   Special Requests BOTTLES DRAWN AEROBIC ONLY 2CC   Final   Culture  Setup Time 12/12/2012 08:53   Final   Culture NO GROWTH 5 DAYS   Final   Report Status 12/18/2012 FINAL   Final  CULTURE, BLOOD (ROUTINE X 2)     Status: None   Collection Time    12/12/12  3:30 AM      Result Value Range Status   Specimen Description BLOOD RIGHT HAND   Final   Special Requests BOTTLES DRAWN AEROBIC ONLY 2CC   Final   Culture  Setup Time 12/12/2012 08:52   Final   Culture NO GROWTH 5 DAYS   Final   Report Status 12/18/2012 FINAL   Final  URINE CULTURE     Status: None   Collection Time    12/12/12  5:47 AM      Result Value Range Status   Specimen Description URINE, CLEAN CATCH   Final   Special Requests NONE   Final   Culture  Setup Time 12/12/2012 05:47   Final   Colony Count 60,000 COLONIES/ML   Final   Culture     Final   Value: STAPHYLOCOCCUS SPECIES (COAGULASE NEGATIVE)     Note: RIFAMPIN AND GENTAMICIN SHOULD NOT BE USED AS SINGLE DRUGS FOR TREATMENT OF STAPH INFECTIONS.     ENTEROCOCCUS SPECIES   Report Status 12/14/2012 FINAL   Final   Organism ID, Bacteria STAPHYLOCOCCUS SPECIES (COAGULASE NEGATIVE)   Final   Organism ID, Bacteria ENTEROCOCCUS SPECIES   Final  URINE CULTURE     Status: None   Collection Time    12/17/12  7:51 PM      Result Value Range Status   Specimen Description URINE, RANDOM   Final   Special Requests NONE   Final   Culture  Setup Time 12/17/2012 20:28   Final   Colony Count >=100,000 COLONIES/ML   Final   Culture ENTEROCOCCUS SPECIES   Final   Report Status 12/19/2012 FINAL   Final   Organism ID, Bacteria ENTEROCOCCUS SPECIES   Final  Studies/Results: No results found.  Medications:  Scheduled: . aspirin  81 mg Oral Daily  . atorvastatin  80 mg Oral q1800  . carvedilol   6.25 mg Oral BID WC  . citalopram  20 mg Oral Daily  . clopidogrel  75 mg Oral Q breakfast  . feeding supplement  237 mL Oral Q24H  . insulin aspart  0-20 Units Subcutaneous TID WC  . insulin aspart  0-5 Units Subcutaneous QHS  . insulin aspart  3 Units Subcutaneous TID WC  . insulin glargine  12 Units Subcutaneous BID  . isosorbide mononitrate  15 mg Oral Daily  . pantoprazole  40 mg Oral Q1200  . saccharomyces boulardii  250 mg Oral BID  . senna-docusate  1 tablet Oral QHS  . vancomycin  1,000 mg Intravenous Q24H   Continuous: . sodium chloride 50 mL/hr at 12/20/12 1130   MVH:QIONGEXBMWUXL, ALPRAZolam, fentaNYL, ondansetron, traZODone  Assessment: Active Problems:   DKA (diabetic ketoacidoses)   NSTEMI (non-ST elevated myocardial infarction)   Encephalopathy acute   Acute renal failure   Anemia   Hypocalcemia   Hypomagnesemia   Thrombocytopenia   Enterococcus UTI   Cardiomyopathy, ischemic   Depression with anxiety   DM type 1 (diabetes mellitus, type 1)    1.Type 1 diabetes mellitus. Generally uncontrolled. Will adjust insulin accordingly. Diabetes coordinator recommendations noted and appreciated. Hemoglobin A1c 9.0.   Metabolic acidosis secondary to DKA, starvation, chronic diarrhea, and acute kidney injury. Acidosis has resolved.  Cardiomyopathy with ejection fraction of approximately 45% with moderate hypokinesis of the inferior walls. Query ischemic cardiomyopathy.  Non-ST elevation myocardial infarction with a history of CAD and history of CABG. She has no complaints of chest pain. Continue medical therapy as recommended by cardiology. Cardiac catheterization not performed due to the patient's renal function. She will followup with her cardiologist in [place]. Of note, 2-D echocardiogram 12/12/2012 was severely technically limited, however there was cardiomyopathy with ejection fraction of around 45%, per cardiology.  Acute encephalopathy in the setting of  dementia and metabolic derangements. Resolved.  Acute renal injury/acute renal failure. Secondary to prerenal azotemia in the setting of ACE inhibitor therapy and diuretics. Her creatinine was 4.84 on admission. Her renal function has improved significantly. We'll continue gentle IV fluids.   Anemia and thrombocytopenia. Anemia panel noted for normal vitamin B12 and folate. Total iron is low at 17. Anemia is likely secondary to iron deficiency, acute and chronic disease, hemodilution, venipunctures, and acute renal failure. The thrombocytopenia may be in part due to acute and chronic illness and underlying infection (UTI).  Electrolyte abnormalities including hypomagnesemia, hypocalcemia, hypokalemia, et Karie Soda. Serum potassium and magnesium levels have improved. Calcium level is low, but corrects with albumin level. Elevated serum potassium this morning was erroneous. Repeat potassium within normal limits.  Enterococcus urinary tract infection.  IV vancomycin started.  Physical deconditioning. PT recommended skilled nursing facility placement short-term. This was discussed with the patient in the patient's daughter. The patient is favoring going home with home health services.    Plan: Continue current management. Possible discharge tomorrow with home health services.    LOS: 8 days   Christine Hughes 12/20/2012, 3:21 PM

## 2012-12-20 NOTE — Clinical Social Work Psychosocial (Signed)
     Clinical Social Work Department BRIEF PSYCHOSOCIAL ASSESSMENT 12/20/2012  Patient:  Christine Hughes, Christine Hughes     Account Number:  [id number]     Admit date:  12/12/2012  Clinical Social Worker:  Lourdes Sledge  Date/Time:  12/20/2012 02:01 PM  Referred by:  RN  Date Referred:  12/20/2012 Referred for  SNF Placement   Other Referral:   Interview type:  Patient Other interview type:   CSW also completed assessment with pt daughter Christine Hughes [phone]    PSYCHOSOCIAL DATA Living Status:  ALONE Admitted from facility:   Level of care:   Primary support name:  Christine Hughes [phone] Primary support relationship to patient:  CHILD, ADULT Degree of support available:   Pt daughter states pt has a CNA that comes 5 days a week (3hrs) and daughter is able to provide support.    CURRENT CONCERNS Current Concerns  Post-Acute Placement   Other Concerns:    SOCIAL WORK ASSESSMENT / PLAN Assisting CSW informed that PT is recommending SNF/24hr care.    CSW visited pt room introduced herself and role. CSW explored pt living situation and amount of support pt has at home. Pt confirmed that she lives alone at home however has a CNA that comes daily for 3 hrs and assists with pt needs. Pt also stated she has supportive children who are also involved in her care. Pt stated she would be returning home at discharge. Pt consented for CSW to speak with daughter Christine Hughes [phone]. CSW spoke with daughter who confirmed that pt has significant help at home and therefore family prefers for pt to return home at d/c with [facility] needs. Daughter states pt has been to rehab in the past however pt does better when she returns home with services.    CSW thanked pt and daughter for information and informed them that CSW would notify RNCM who would set-up [facility] services, pt and daughter agreedable and appreciative.   Assessment/plan status:  No Further Intervention Required Other  assessment/ plan:   Information/referral to community resources:   No resources given at this time as pt and daughter have declined placement. Pt to dc home with HH.    PATIENTS/FAMILYS RESPONSE TO PLAN OF CARE: Pt sitting on the recliner alert and appeared oriented. Pt responding appropriately to CSW questions. Daughter confirmed all the information shared to be true. Pt appreciative of visit and requests to return home at dc.    CSW signing off.

## 2012-12-21 MED ORDER — TRAZODONE HCL 100 MG PO TABS: 50.0000 mg | ORAL_TABLET | Freq: Every day | ORAL | Status: AC

## 2012-12-21 MED ORDER — CARVEDILOL 6.25 MG PO TABS: 6.2500 mg | ORAL_TABLET | Freq: Two times a day (BID) | ORAL | Status: DC

## 2012-12-21 MED ORDER — FUROSEMIDE 20 MG PO TABS: 20.0000 mg | ORAL_TABLET | Freq: Every day | ORAL | Status: AC

## 2012-12-21 MED ORDER — ATORVASTATIN CALCIUM 80 MG PO TABS: 80.0000 mg | ORAL_TABLET | Freq: Every day | ORAL | Status: DC

## 2012-12-21 MED ORDER — LEVOFLOXACIN 500 MG PO TABS: 500.0000 mg | ORAL_TABLET | Freq: Every day | ORAL | Status: DC

## 2012-12-21 MED ORDER — ISOSORBIDE MONONITRATE ER 30 MG PO TB24: 15.0000 mg | ORAL_TABLET | Freq: Every day | ORAL | Status: AC

## 2012-12-21 MED ORDER — ASPIRIN 81 MG PO CHEW: 81.0000 mg | CHEWABLE_TABLET | Freq: Every day | ORAL | Status: AC

## 2012-12-21 NOTE — Progress Notes (Signed)
Inpatient Diabetes Program Recommendations  AACE/ADA: New Consensus Statement on Inpatient Glycemic Control (2013)  Target Ranges:  Prepandial:   less than 140 mg/dL      Peak postprandial:   less than 180 mg/dL (1-2 hours)      Critically ill patients:  140 - 180 mg/dL  Results for MALAYIA, SPIZZIRRI (MRN [id number]) as of 12/21/2012 14:19  Ref. Range 12/20/2012 11:33 12/20/2012 16:47 12/20/2012 21:08 12/21/2012 06:33 12/21/2012 11:13  Glucose-Capillary Latest Range: 70-99 mg/dL 102 (H) 725 (H) 366 (H) 280 (H) 213 (H)   Inpatient Diabetes Program Recommendations Insulin - Basal: Increase Lantus to 15 BID  Thank you  Piedad Climes BSN, RN,CDE Inpatient Diabetes Coordinator [phone] (team pager)

## 2012-12-21 NOTE — Progress Notes (Signed)
Discharge instructions along with med list and script provided. IV d/c'd with catheter intact. Assisted patient to wheelchair; transported to main lobby for d/c home. Mamie Levers

## 2012-12-22 DIAGNOSIS — F0390 Unspecified dementia without behavioral disturbance: Secondary | ICD-10-CM | POA: Diagnosis present

## 2012-12-22 NOTE — Discharge Summary (Signed)
Physician Discharge Summary  Christine Hughes GNF:[id number] DOB: 01-Feb-1946 DOA: 12/12/2012  PCP: Katherina Right, MD  Admit date: 12/12/2012 Discharge date: 12/21/2012  Time spent: Greater than 30 minutes  Recommendations for Outpatient Follow-up:  1. She will followup with her primary cardiologist, Dr. Earna Coder, next week as scheduled. 2. Home health services were ordered prior to discharge.  Discharge Diagnoses:   1. Non-ST elevation myocardial infarction. Treated medically. 2. Acute systolic congestive heart failure/ischemic cardiomyopathy. The patient's ejection fraction was 45%, per 2-D echocardiogram. 3. Coronary artery disease, status post CABG and stents in the past. 4. Peripheral vascular disease, status post bilateral lower extremity amputations in the past. 5. Diabetes mellitus, type 1, with DKA. The patient hemoglobin A1c was 9.0. 6. Acute renal failure/acute kidney injury secondary to a combination of ACE inhibitor, diuretics, and hypovolemia. The patient's creatinine was 4.84 on admission and 1.56 at the time of discharge. Baseline creatinine was unknown. Query stage stage III chronic kidney disease. 7. Mixed acidosis, secondary to DKA, starvation, acute kidney injury, and recent history of subacute/chronic diarrhea. The patient's CO2 was 9 on admission and 28 at the time of discharge. 8. Thrombocytopenia. The patient's platelet count was 79 on admission and 83 at the time of discharge. 9. Anemia. The patient's hemoglobin was 10 on admission and 8.7 at the time of discharge. Her anemia, revealed the low iron of 17, TIBC of 207, ferritin of 69, folate of 8.0, and vitamin B12 of 439. 10. Hypomagnesemia, repleted. 11. Hypokalemia, repleted. 12. Hypocalcemia, corrected with albumin level. 13. Enterococcus urinary tract infection. 14. Acute encephalopathy, multifactorial. 15. Stage II sacral ulcer. 16. Obesity. 17. Chronic dementia. 18. Chronic anxiety and depression. 19.  Deconditioning.   Discharge Condition: Improved.  Diet recommendation: Heart healthy/carbohydrate modified.  Filed Weights   12/19/12 0515 12/20/12 0517 12/21/12 0501  Weight: 77 kg (169 lb 12.1 oz) 79.7 kg (175 lb 11.3 oz) 80.2 kg (176 lb 12.9 oz)    History of present illness:  The patient is a 67 year old woman with a history significant for peripheral vascular disease with bilateral lower extremity amputations, frequent urinary tract infections, subacute diarrhea, and type 1 diabetes mellitus, who presented to the [facility] ED with acute encephalopathy. She was diagnosed with DKA, acute renal failure, and an elevated troponin I. She was transferred to [facility] and admitted to the critical care service on 12/12/2012.   Brief narrative: On admission, the patient's lab data were significant for a troponin I of 4.45, platelet count of 79, CO2 of 9, glucose of 350, creatinine of 4.84, and magnesium 1.4. Her EKG revealed a heart rate of 100 beats per minute, first degree AV block, and age indeterminate septal infarct. Her chest x-ray revealed low lung volumes. She was admitted to the ICU. An insulin drip and IV fluids were started for treatment of DKA. Heparin drip, aspirin, Plavix, Lipitor, and metoprolol were started for treatment of non-ST elevation myocardial infarction.  Hospital Course:   Non-ST elevation myocardial infarction/ischemic cardiomyopathy/ history of CAD and history of CABG. [place] [facility] was consulted. Cardiologist, Dr. Riley Kill and colleagues recommended deferring evaluation with a cardiac catheterization secondary to the patient's acute renal failure and because the patient lacked ischemic chest pain. They advised continued medical therapy with heparin, aspirin, Plavix, Lipitor, and metoprolol. ACE inhibitor therapy was not ordered due to the patient's acute renal failure. Eventually Isordil was added. Her 2-D echocardiogram revealed an ejection fraction of  approximately 45%, but the study was severely technically limited. Her troponin  I reached a high of 13.73. It was rechecked only once more at 11.04. She was given Lasix on one or 2 occasions for chest congestion and mild acute systolic heart failure after volume resuscitation. However, when the patient's blood pressure began to decrease on multiple medications, Lasix, Isordil, and metoprolol had to be held. As her blood pressure improved and she improved clinically, her medication regimen was changed to carvedilol twice a day, isosorbide mononitrate, Lipitor, aspirin, Plavix, and oral Lasix. She will followup with her primary cardiologist, Dr. Earna Coder as scheduled.  Type 1 diabetes mellitus with DKA. The patient was started on IV fluid hydration and the insulin drip protocol. Once her DKA resolved, she was started on Lantus and sliding-scale NovoLog. The registered dietitian and diabetes coordinator made recommendations. Eventually, the patient was restarted on 70/30 insulin at dosing lower than her home dosing. With this regimen, her blood glucose remained in the 200s. Therefore, she was restarted on her usual home dose of 70/30 insulin upon discharge. Her hemoglobin A1c 9.0. Further management of her diabetes was deferred to her primary care physician.  Mixed/metabolic acidosis. The patient's CO2 was 9 on admission. The mixed acidosis without be secondary to a combination of DKA, starvation, chronic diarrhea, and acute kidney injury. She was treated with a bicarbonate drip and management of her acute conditions. Her acidosis resolved.   Acute renal injury/acute renal failure. The patient's creatinine was 4.84  And her BUN was 51 on admission. Her baseline creatinine was unknown, however, it was suspected that she had underlying chronic kidney disease. Her renal function improved progressively with IV fluid hydration and medication changes. Her creatinine was 1.56 at the time of discharge.    Anemia and  thrombocytopenia. She was anemic and thrombocytopenic on admission. An anemia panel was ordered and it revealed a normal vitamin B12 and folate. Total iron was low at 17. The anemia appeared to be secondary to mild iron deficiency and/or chronic disease. The etiology of her thrombocytopenia was unclear, but may have been secondary to the urinary tract infection.   Electrolyte abnormalities including hypomagnesemia, hypocalcemia, hypokalemia. The patient had several metabolic arrangements of her electrolytes. With IV fluid repletion and supplementation of the electrolyte losses, her electrolytes levels improved or corrected.  There was one lab result that was erroneous for hyperkalemia.    Enterococcus urinary tract infection. There was no obvious infectious etiology on admission. However later, urinalysis was ordered. It was consistent with infection. The urine culture eventually grew out some coagulase negative Staphylococcus and enterococcus. Both were sensitive to vancomycin and Levaquin. She was treated with Levaquin for a few days intravenously and discharged on oral Levaquin.  Acute encephalopathy/dementia/depression/anxiety. The patient was confused and had several episodes of trying to get out of bed unattended (she is also a bilateral amputee). Her psychotropic medications were all withheld on admission. She was given Haldol as needed. Eventually, her confusion/encephalopathy resolved. She was restarted on her chronic medications for depression and anxiety and dementia at the time of discharge.   Physical deconditioning. The patient was evaluated by the physical therapist who recommended ongoing rehabilitation in a skilled environment. However, the patient  did not want to go to a skilled nursing facility. Her daughter agreed. Therefore, home health services were ordered upon discharge.     Procedures: 2-D echocardiogram: 12/12/2012:Study Conclusions  - Left ventricle: Techncially severely  limited. Moderate hypokinesis of inferior and inferolateral walls. EF probably 45%, but can not see apex well. The cavity size was normal. -  Aortic valve: Sclerosis without stenosis. No significant regurgitation. - Mitral valve: Mild regurgitation. - Left atrium: The atrium was mildly dilated. - Right ventricle: Not able to assess RV function due to poor visualization. I suspect some RV dysfunction. The cavity size was mildly dilated. - Tricuspid valve: Poorly visualized. Mild regurgitation.     Consultations:  [place] [facility]  [place] [facility]  Discharge Exam: Filed Vitals:   12/20/12 1335 12/20/12 2010 12/21/12 0501 12/21/12 1330  BP: 100/42 106/44 121/56 112/55  Pulse: 83 74 88 75  Temp: 98.3 F (36.8 C) 98 F (36.7 C) 98.1 F (36.7 C) 98.2 F (36.8 C)  TempSrc: Oral Oral Oral Oral  Resp: 18 18 17 18   Height:      Weight:   80.2 kg (176 lb 12.9 oz)   SpO2: 97% 97% 100% 98%    General: Alert but debilitating appearing 67 year old Caucasian woman sitting up in bed, in no acute distress. Cardiovascular: S1, S2, with a 1-2/6 systolic murmur. Respiratory: Clear to auscultation bilaterally.  Discharge Instructions  Discharge Orders   Future Orders Complete By Expires     Diet - low sodium heart healthy  As directed     Diet Carb Modified  As directed     Discharge instructions  As directed     Comments:      There has been changes in your medications. Follow the prescription instructions. If Levaquin causes a rash or itching, stop taking it.    Increase activity slowly  As directed         Medication List    STOP taking these medications       dicyclomine 10 MG capsule  Commonly known as:  BENTYL     doxycycline 100 MG tablet  Commonly known as:  VIBRA-TABS     metoprolol tartrate 25 MG tablet  Commonly known as:  LOPRESSOR     niacin 500 MG CR tablet  Commonly known as:  NIASPAN      TAKE these medications       ALPRAZolam 0.5  MG tablet  Commonly known as:  XANAX  Take 0.5 mg by mouth 2 (two) times daily as needed for anxiety.     aspirin 81 MG chewable tablet  Chew 1 tablet (81 mg total) by mouth daily.     atorvastatin 80 MG tablet  Commonly known as:  LIPITOR  Take 1 tablet (80 mg total) by mouth daily. For treatment of your high cholesterol and heart attack. (The dose was increased.)     carvedilol 6.25 MG tablet  Commonly known as:  COREG  Take 1 tablet (6.25 mg total) by mouth 2 (two) times daily with a meal. Medication for your heart and blood pressure.     citalopram 40 MG tablet  Commonly known as:  CELEXA  Take 40 mg by mouth daily.     clopidogrel 75 MG tablet  Commonly known as:  PLAVIX  Take 75 mg by mouth daily.     donepezil 10 MG tablet  Commonly known as:  ARICEPT  Take 10 mg by mouth at bedtime.     esomeprazole 40 MG capsule  Commonly known as:  NEXIUM  Take 40 mg by mouth daily before breakfast.     furosemide 20 MG tablet  Commonly known as:  LASIX  Take 1 tablet (20 mg total) by mouth daily.     insulin NPH-regular (70-30) 100 UNIT/ML injection  Commonly known as:  NOVOLIN 70/30  Inject  34-48 Units into the skin. 48 units in AM, 34 units in PM     isosorbide mononitrate 30 MG 24 hr tablet  Commonly known as:  IMDUR  Take 0.5 tablets (15 mg total) by mouth daily.     levofloxacin 500 MG tablet  Commonly known as:  LEVAQUIN  Take 1 tablet (500 mg total) by mouth daily. Antibiotic.     lisinopril 2.5 MG tablet  Commonly known as:  PRINIVIL,ZESTRIL  Take 2.5 mg by mouth daily.     nitroGLYCERIN 0.4 MG SL tablet  Commonly known as:  NITROSTAT  Place 0.4 mg under the tongue every 5 (five) minutes as needed for chest pain.     traZODone 100 MG tablet  Commonly known as:  DESYREL  Take 0.5 tablets (50 mg total) by mouth at bedtime.           Follow-up Information   Please follow up. (Follow up with your cardiologist and primary doctor in 1-2 weeks.)         The results of significant diagnostics from this hospitalization (including imaging, microbiology, ancillary and laboratory) are listed below for reference.    Significant Diagnostic Studies: Dg Chest Port 1 View  12/12/2012  *RADIOLOGY REPORT*  Clinical Data: Pneumonia.  Short of breath.  PORTABLE CHEST - 1 VIEW  Comparison: 11/29/2012.  Findings: Low volume chest. CABG.  Mild basilar atelectasis. Monitoring leads are projected over the chest. No airspace disease. Negative for pneumonia.  No effusion.  IMPRESSION: Low volume chest without acute cardiopulmonary disease.   Original Report Authenticated By: Andreas Newport, M.D.     Microbiology: Recent Results (from the past 240 hour(s))  URINE CULTURE     Status: None   Collection Time    12/17/12  7:51 PM      Result Value Range Status   Specimen Description URINE, RANDOM   Final   Special Requests NONE   Final   Culture  Setup Time 12/17/2012 20:28   Final   Colony Count >=100,000 COLONIES/ML   Final   Culture ENTEROCOCCUS SPECIES   Final   Report Status 12/19/2012 FINAL   Final   Organism ID, Bacteria ENTEROCOCCUS SPECIES   Final     Labs: Basic Metabolic Panel:  Recent Labs Lab 12/16/12 0700 12/17/12 0640 12/18/12 0548 12/19/12 0550 12/20/12 0500 12/20/12 0949  NA 143  --  142 141 142 138  K 2.9*  --  4.5 4.1 5.6* 4.0  CL 104  --  105 105 107 103  CO2 28  --  29 26 29 28   GLUCOSE 105*  --  173* 300* 272* 272*  BUN 16  --  12 16 17 16   CREATININE 1.51*  --  1.45* 1.65* 1.72* 1.56*  CALCIUM 7.4*  --  8.2* 8.1* 8.3* 8.0*  MG 1.3* 1.4* 1.5  --  1.2*  --   PHOS 2.2* 2.7  --   --   --   --    Liver Function Tests:  Recent Labs Lab 12/18/12 0548  AST 25  ALT 17  ALKPHOS 75  BILITOT 0.7  PROT 5.4*  ALBUMIN 2.8*   No results found for this basename: LIPASE, AMYLASE,  in the last 168 hours No results found for this basename: AMMONIA,  in the last 168 hours CBC:  Recent Labs Lab 12/16/12 0700  12/18/12 0548 12/19/12 0550  WBC 8.7 6.4 5.7  HGB 9.0* 8.8* 8.7*  HCT 26.6* 26.9* 26.7*  MCV 89.6 92.4  92.7  PLT 55* 70* 83*   Cardiac Enzymes: No results found for this basename: CKTOTAL, CKMB, CKMBINDEX, TROPONINI,  in the last 168 hours BNP: BNP (last 3 results) No results found for this basename: PROBNP,  in the last 8760 hours CBG:  Recent Labs Lab 12/20/12 1133 12/20/12 1647 12/20/12 2108 12/21/12 0633 12/21/12 1113  GLUCAP 253* 235* 161* 280* 213*       Signed:  Turon Kilmer  [facility] 12/22/2012, 7:29 PM

## 2012-12-23 ENCOUNTER — Encounter (HOSPITAL_COMMUNITY): Payer: Self-pay | Admitting: Internal Medicine

## 2013-12-25 ENCOUNTER — Inpatient Hospital Stay (HOSPITAL_COMMUNITY)
Admission: EM | Admit: 2013-12-25 | Discharge: 2014-01-04 | DRG: 292 | Disposition: A | Discharge status: Home / Self Care | Payer: Medicare Other | Attending: Internal Medicine | Admitting: Internal Medicine

## 2013-12-25 ENCOUNTER — Emergency Department (HOSPITAL_COMMUNITY): Payer: Medicare Other

## 2013-12-25 ENCOUNTER — Encounter (HOSPITAL_COMMUNITY): Payer: Self-pay | Admitting: Emergency Medicine

## 2013-12-25 DIAGNOSIS — N058 Unspecified nephritic syndrome with other morphologic changes: Secondary | ICD-10-CM | POA: Diagnosis present

## 2013-12-25 DIAGNOSIS — Z9861 Coronary angioplasty status: Secondary | ICD-10-CM

## 2013-12-25 DIAGNOSIS — E1159 Type 2 diabetes mellitus with other circulatory complications: Secondary | ICD-10-CM | POA: Diagnosis present

## 2013-12-25 DIAGNOSIS — I079 Rheumatic tricuspid valve disease, unspecified: Secondary | ICD-10-CM | POA: Diagnosis present

## 2013-12-25 DIAGNOSIS — I70209 Unspecified atherosclerosis of native arteries of extremities, unspecified extremity: Secondary | ICD-10-CM | POA: Diagnosis present

## 2013-12-25 DIAGNOSIS — S88119A Complete traumatic amputation at level between knee and ankle, unspecified lower leg, initial encounter: Secondary | ICD-10-CM

## 2013-12-25 DIAGNOSIS — I509 Heart failure, unspecified: Secondary | ICD-10-CM

## 2013-12-25 DIAGNOSIS — I798 Other disorders of arteries, arterioles and capillaries in diseases classified elsewhere: Secondary | ICD-10-CM | POA: Diagnosis present

## 2013-12-25 DIAGNOSIS — Z87891 Personal history of nicotine dependence: Secondary | ICD-10-CM

## 2013-12-25 DIAGNOSIS — Z951 Presence of aortocoronary bypass graft: Secondary | ICD-10-CM

## 2013-12-25 DIAGNOSIS — Z853 Personal history of malignant neoplasm of breast: Secondary | ICD-10-CM

## 2013-12-25 DIAGNOSIS — I10 Essential (primary) hypertension: Secondary | ICD-10-CM | POA: Diagnosis present

## 2013-12-25 DIAGNOSIS — K5289 Other specified noninfective gastroenteritis and colitis: Secondary | ICD-10-CM | POA: Diagnosis present

## 2013-12-25 DIAGNOSIS — I2589 Other forms of chronic ischemic heart disease: Secondary | ICD-10-CM | POA: Diagnosis present

## 2013-12-25 DIAGNOSIS — E875 Hyperkalemia: Secondary | ICD-10-CM | POA: Diagnosis present

## 2013-12-25 DIAGNOSIS — Z86711 Personal history of pulmonary embolism: Secondary | ICD-10-CM

## 2013-12-25 DIAGNOSIS — R197 Diarrhea, unspecified: Secondary | ICD-10-CM | POA: Diagnosis present

## 2013-12-25 DIAGNOSIS — I129 Hypertensive chronic kidney disease with stage 1 through stage 4 chronic kidney disease, or unspecified chronic kidney disease: Secondary | ICD-10-CM | POA: Diagnosis present

## 2013-12-25 DIAGNOSIS — K219 Gastro-esophageal reflux disease without esophagitis: Secondary | ICD-10-CM | POA: Diagnosis present

## 2013-12-25 DIAGNOSIS — I2789 Other specified pulmonary heart diseases: Secondary | ICD-10-CM | POA: Diagnosis present

## 2013-12-25 DIAGNOSIS — F0390 Unspecified dementia without behavioral disturbance: Secondary | ICD-10-CM | POA: Diagnosis present

## 2013-12-25 DIAGNOSIS — I441 Atrioventricular block, second degree: Secondary | ICD-10-CM | POA: Diagnosis present

## 2013-12-25 DIAGNOSIS — D649 Anemia, unspecified: Secondary | ICD-10-CM | POA: Diagnosis present

## 2013-12-25 DIAGNOSIS — E1129 Type 2 diabetes mellitus with other diabetic kidney complication: Secondary | ICD-10-CM | POA: Diagnosis present

## 2013-12-25 DIAGNOSIS — I5023 Acute on chronic systolic (congestive) heart failure: Principal | ICD-10-CM

## 2013-12-25 DIAGNOSIS — N179 Acute kidney failure, unspecified: Secondary | ICD-10-CM

## 2013-12-25 DIAGNOSIS — F341 Dysthymic disorder: Secondary | ICD-10-CM | POA: Diagnosis present

## 2013-12-25 DIAGNOSIS — I2510 Atherosclerotic heart disease of native coronary artery without angina pectoris: Secondary | ICD-10-CM

## 2013-12-25 DIAGNOSIS — Z89512 Acquired absence of left leg below knee: Secondary | ICD-10-CM

## 2013-12-25 DIAGNOSIS — Z794 Long term (current) use of insulin: Secondary | ICD-10-CM

## 2013-12-25 DIAGNOSIS — E785 Hyperlipidemia, unspecified: Secondary | ICD-10-CM | POA: Diagnosis present

## 2013-12-25 DIAGNOSIS — F418 Other specified anxiety disorders: Secondary | ICD-10-CM | POA: Diagnosis present

## 2013-12-25 DIAGNOSIS — N183 Chronic kidney disease, stage 3 unspecified: Secondary | ICD-10-CM

## 2013-12-25 DIAGNOSIS — Z89511 Acquired absence of right leg below knee: Secondary | ICD-10-CM

## 2013-12-25 DIAGNOSIS — I214 Non-ST elevation (NSTEMI) myocardial infarction: Secondary | ICD-10-CM

## 2013-12-25 DIAGNOSIS — E109 Type 1 diabetes mellitus without complications: Secondary | ICD-10-CM | POA: Diagnosis present

## 2013-12-25 DIAGNOSIS — Z7982 Long term (current) use of aspirin: Secondary | ICD-10-CM

## 2013-12-25 DIAGNOSIS — E876 Hypokalemia: Secondary | ICD-10-CM | POA: Diagnosis present

## 2013-12-25 DIAGNOSIS — I252 Old myocardial infarction: Secondary | ICD-10-CM

## 2013-12-25 DIAGNOSIS — I255 Ischemic cardiomyopathy: Secondary | ICD-10-CM

## 2013-12-25 DIAGNOSIS — I739 Peripheral vascular disease, unspecified: Secondary | ICD-10-CM | POA: Diagnosis present

## 2013-12-25 LAB — BASIC METABOLIC PANEL
BUN: 26 mg/dL — AB (ref 6–23)
CHLORIDE: 101 meq/L (ref 96–112)
CO2: 25 meq/L (ref 19–32)
Calcium: 9 mg/dL (ref 8.4–10.5)
Creatinine, Ser: 1.42 mg/dL — ABNORMAL HIGH (ref 0.50–1.10)
GFR calc Af Amer: 43 mL/min — ABNORMAL LOW (ref >90)
GFR, EST NON AFRICAN AMERICAN: 37 mL/min — AB (ref >90)
GLUCOSE: 60 mg/dL — AB (ref 70–99)
Potassium: 3.3 mEq/L — ABNORMAL LOW (ref 3.7–5.3)
SODIUM: 144 meq/L (ref 137–147)

## 2013-12-25 LAB — PRO B NATRIURETIC PEPTIDE: Pro B Natriuretic peptide (BNP): 12286 pg/mL — ABNORMAL HIGH (ref 0–125)

## 2013-12-25 LAB — CBC
HEMATOCRIT: 27.4 % — AB (ref 36.0–46.0)
Hemoglobin: 8.8 g/dL — ABNORMAL LOW (ref 12.0–15.0)
MCH: 29.2 pg (ref 26.0–34.0)
MCHC: 32.1 g/dL (ref 30.0–36.0)
MCV: 91 fL (ref 78.0–100.0)
Platelets: 394 10*3/uL (ref 150–400)
RBC: 3.01 MIL/uL — AB (ref 3.87–5.11)
RDW: 15.7 % — ABNORMAL HIGH (ref 11.5–15.5)
WBC: 9.5 10*3/uL (ref 4.0–10.5)

## 2013-12-25 LAB — URINALYSIS, ROUTINE W REFLEX MICROSCOPIC
Bilirubin Urine: NEGATIVE
Glucose, UA: NEGATIVE mg/dL
Hgb urine dipstick: NEGATIVE
Ketones, ur: NEGATIVE mg/dL
LEUKOCYTES UA: NEGATIVE
Nitrite: NEGATIVE
PROTEIN: NEGATIVE mg/dL
Specific Gravity, Urine: 1.008 (ref 1.005–1.030)
UROBILINOGEN UA: 0.2 mg/dL (ref 0.0–1.0)
pH: 7.5 (ref 5.0–8.0)

## 2013-12-25 LAB — CBG MONITORING, ED
GLUCOSE-CAPILLARY: 63 mg/dL — AB (ref 70–99)
Glucose-Capillary: 61 mg/dL — ABNORMAL LOW (ref 70–99)
Glucose-Capillary: 137 mg/dL — ABNORMAL HIGH (ref 70–99)

## 2013-12-25 LAB — I-STAT TROPONIN, ED: TROPONIN I, POC: 0.03 ng/mL (ref 0.00–0.08)

## 2013-12-25 MED ORDER — CLOPIDOGREL BISULFATE 75 MG PO TABS
75.0000 mg | ORAL_TABLET | Freq: Every day | ORAL | Status: DC
  Administered 2013-12-26: 75 mg via ORAL
  Filled 2013-12-25 (×2): qty 1

## 2013-12-25 MED ORDER — CITALOPRAM HYDROBROMIDE 40 MG PO TABS
40.0000 mg | ORAL_TABLET | Freq: Every day | ORAL | Status: DC
  Administered 2013-12-26 – 2014-01-01 (×7): 40 mg via ORAL
  Filled 2013-12-25 (×7): qty 1

## 2013-12-25 MED ORDER — LISINOPRIL 2.5 MG PO TABS
2.5000 mg | ORAL_TABLET | Freq: Every day | ORAL | Status: DC
  Administered 2013-12-26 – 2013-12-30 (×5): 2.5 mg via ORAL
  Filled 2013-12-25 (×5): qty 1

## 2013-12-25 MED ORDER — DONEPEZIL HCL 10 MG PO TABS
10.0000 mg | ORAL_TABLET | Freq: Every day | ORAL | Status: DC
  Administered 2013-12-26 – 2014-01-04 (×10): 10 mg via ORAL
  Filled 2013-12-25 (×10): qty 1

## 2013-12-25 MED ORDER — ISOSORBIDE MONONITRATE 15 MG HALF TABLET
15.0000 mg | ORAL_TABLET | Freq: Every day | ORAL | Status: DC
  Administered 2013-12-26 – 2014-01-02 (×8): 15 mg via ORAL
  Filled 2013-12-25 (×8): qty 1

## 2013-12-25 MED ORDER — FUROSEMIDE 10 MG/ML IJ SOLN
60.0000 mg | Freq: Once | INTRAMUSCULAR | Status: AC
  Administered 2013-12-25: 60 mg via INTRAVENOUS
  Filled 2013-12-25: qty 6

## 2013-12-25 MED ORDER — INSULIN ASPART PROT & ASPART (70-30 MIX) 100 UNIT/ML ~~LOC~~ SUSP
34.0000 [IU] | Freq: Every day | SUBCUTANEOUS | Status: DC
  Administered 2013-12-26 – 2014-01-03 (×9): 34 [IU] via SUBCUTANEOUS

## 2013-12-25 MED ORDER — PANTOPRAZOLE SODIUM 40 MG PO TBEC
40.0000 mg | DELAYED_RELEASE_TABLET | Freq: Every day | ORAL | Status: DC
  Administered 2013-12-26 – 2014-01-04 (×10): 40 mg via ORAL
  Filled 2013-12-25 (×10): qty 1

## 2013-12-25 MED ORDER — POTASSIUM CHLORIDE CRYS ER 20 MEQ PO TBCR
40.0000 meq | EXTENDED_RELEASE_TABLET | Freq: Two times a day (BID) | ORAL | Status: DC
Start: 2013-12-25 — End: 2013-12-30
  Administered 2013-12-26 – 2013-12-29 (×8): 40 meq via ORAL
  Filled 2013-12-25 (×11): qty 2

## 2013-12-25 MED ORDER — POTASSIUM CHLORIDE CRYS ER 20 MEQ PO TBCR
40.0000 meq | EXTENDED_RELEASE_TABLET | Freq: Once | ORAL | Status: AC
  Administered 2013-12-25: 40 meq via ORAL
  Filled 2013-12-25: qty 2

## 2013-12-25 MED ORDER — ATORVASTATIN CALCIUM 80 MG PO TABS
80.0000 mg | ORAL_TABLET | Freq: Every day | ORAL | Status: DC
  Administered 2013-12-26 – 2014-01-04 (×10): 80 mg via ORAL
  Filled 2013-12-25 (×11): qty 1

## 2013-12-25 MED ORDER — NITROGLYCERIN 0.4 MG SL SUBL
0.4000 mg | SUBLINGUAL_TABLET | SUBLINGUAL | Status: DC | PRN
  Administered 2013-12-27: 0.4 mg via SUBLINGUAL
  Filled 2013-12-25: qty 1

## 2013-12-25 MED ORDER — SODIUM CHLORIDE 0.9 % IJ SOLN
3.0000 mL | Freq: Two times a day (BID) | INTRAMUSCULAR | Status: DC
  Administered 2013-12-26 – 2014-01-04 (×17): 3 mL via INTRAVENOUS

## 2013-12-25 MED ORDER — SODIUM CHLORIDE 0.9 % IV SOLN: 250.0000 mL | INTRAVENOUS | Status: DC | PRN

## 2013-12-25 MED ORDER — FUROSEMIDE 10 MG/ML IJ SOLN
40.0000 mg | Freq: Two times a day (BID) | INTRAMUSCULAR | Status: DC
  Administered 2013-12-26 – 2013-12-28 (×5): 40 mg via INTRAVENOUS
  Filled 2013-12-25 (×8): qty 4

## 2013-12-25 MED ORDER — HEPARIN SODIUM (PORCINE) 5000 UNIT/ML IJ SOLN
5000.0000 [IU] | Freq: Three times a day (TID) | INTRAMUSCULAR | Status: DC
  Administered 2013-12-26 – 2013-12-28 (×7): 5000 [IU] via SUBCUTANEOUS
  Filled 2013-12-25 (×9): qty 1

## 2013-12-25 MED ORDER — TRAZODONE HCL 50 MG PO TABS
50.0000 mg | ORAL_TABLET | Freq: Every day | ORAL | Status: DC
  Administered 2013-12-26 – 2013-12-27 (×3): 50 mg via ORAL
  Filled 2013-12-25 (×4): qty 1

## 2013-12-25 MED ORDER — INSULIN ASPART PROT & ASPART (70-30 MIX) 100 UNIT/ML ~~LOC~~ SUSP
48.0000 [IU] | Freq: Every day | SUBCUTANEOUS | Status: DC
  Filled 2013-12-25 (×2): qty 10

## 2013-12-25 MED ORDER — INSULIN ASPART 100 UNIT/ML ~~LOC~~ SOLN
0.0000 [IU] | Freq: Three times a day (TID) | SUBCUTANEOUS | Status: DC
Start: 2013-12-26 — End: 2014-01-04
  Administered 2013-12-26: 3 [IU] via SUBCUTANEOUS
  Administered 2013-12-26: 5 [IU] via SUBCUTANEOUS
  Administered 2013-12-27: 8 [IU] via SUBCUTANEOUS
  Administered 2013-12-27: 2 [IU] via SUBCUTANEOUS
  Administered 2013-12-28 – 2013-12-30 (×2): 3 [IU] via SUBCUTANEOUS
  Administered 2013-12-31: 2 [IU] via SUBCUTANEOUS
  Administered 2013-12-31: 3 [IU] via SUBCUTANEOUS
  Administered 2014-01-01: 8 [IU] via SUBCUTANEOUS
  Administered 2014-01-01 – 2014-01-02 (×2): 3 [IU] via SUBCUTANEOUS

## 2013-12-25 MED ORDER — ASPIRIN 81 MG PO CHEW
81.0000 mg | CHEWABLE_TABLET | Freq: Every day | ORAL | Status: DC
  Administered 2013-12-26 – 2014-01-04 (×10): 81 mg via ORAL
  Filled 2013-12-25 (×10): qty 1

## 2013-12-25 MED ORDER — ALPRAZOLAM 0.5 MG PO TABS
0.5000 mg | ORAL_TABLET | Freq: Three times a day (TID) | ORAL | Status: DC | PRN
  Administered 2013-12-26 – 2014-01-04 (×18): 0.5 mg via ORAL
  Filled 2013-12-25 (×18): qty 1

## 2013-12-25 MED ORDER — SODIUM CHLORIDE 0.9 % IJ SOLN: 3.0000 mL | INTRAMUSCULAR | Status: DC | PRN

## 2013-12-25 MED ORDER — CARVEDILOL 6.25 MG PO TABS
6.2500 mg | ORAL_TABLET | Freq: Two times a day (BID) | ORAL | Status: DC
  Administered 2013-12-26 (×2): 6.25 mg via ORAL
  Filled 2013-12-25 (×3): qty 1

## 2013-12-25 NOTE — H&P (Signed)
ADMISSION HISTORY & PHYSICAL   Chief Complaint:  Shortness of breath  Cardiologist: B. Alroy Dust, MD ([place], [place]) Lowella Dell, MD (retired)  Primary Care Physician: Nicholes Stairs, MD  HPI:  This is a 68 y.o. female with a past medical history significant for CAD (CABG in 1999 at [facility]), history of PE, HTN, DM2, GERD, L Breast CA, and dementia.  She believes she was last intervened upon sometime in 2009 or 2010, in [place]. She also has a h/o DM and PVD and is s/p bilat BKA's, which limits activity. She does experience periodic chest pain, usually at rest, approx 1x every few mos. She rarely has to use sl NTG. She has a long h/o colitis.  She was seen in 12/12/2012 in consult for NSTEMI/CAD in the setting of colitis and diarrhea. Due to her CKD, catheterization was not recommended. She had an echocardiogram which showed an EF of 45%, with inferior and inferolateral hypokinesis.  She had not complained of chest pain. 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 She was just discharged from [place] [facility] for pneumonia, sepsis, acute renal failure up to creatinine of 5.  She also had NSTEMI with troponin that peaked just over 2.  No intervention was performed. An echo was repeated which shows the EF is now 40-45% with clear inferior wall motion abnormalities. After discharge, her family did not feel that she was adequately treated and drove her to [facility] for care. Her BNP on admission now is 12,286.  Creatinine is 1.42. She denies chest pain and has mild shortness of breath.  PMHx:  Past Medical History  Diagnosis Date  . Coronary artery disease     a. s/p prior MI;  b. CABG 1999 @ [facility]; c. s/p stenting 2009 or 10.  Marland Kitchen Hypertension   . Anxiety   . Depression   . Dementia   . Diabetes mellitus with complication     a. 11/5007 DKA  . GERD (gastroesophageal reflux disease)     Bilateral BKAs  . H/O hiatal hernia   . Headache(784.0)   . Arthritis   . Anemia   .  Cancer     L breast cancer  . Pulmonary embolism     a. prev coumadin  . Enterococcus UTI 12/19/2012  . Cardiomyopathy, ischemic 12/12/2012    Ejection fraction 45%.  . DKA (diabetic ketoacidoses) 12/12/2012  . Encephalopathy acute 12/12/2012  . Thrombocytopenia 12/12/2012  . NSTEMI (non-ST elevated myocardial infarction) 12/12/2012  . Peripheral vascular disease     Status post bilateral BKA    Past Surgical History  Procedure Laterality Date  . Coronary angioplasty    . Back surgery    . Coronary artery bypass graft    . Appendectomy    . Abdominal hysterectomy    . Mastectomy    . Joint replacement      Hip  . Breast surgery      left  . Fracture surgery      R femur  . Cholecystectomy    . Tonsillectomy    . Leg amputation below knee Bilateral     FAMHx:  Unable to obtain due to patient factors.  SOCHx:   reports that she has quit smoking. She has never used smokeless tobacco. She reports that she does not drink alcohol or use illicit drugs.  ALLERGIES:  Allergies  Allergen Reactions  . Nitrofuran Derivatives Hives, Shortness Of Breath and Nausea And Vomiting  . Penicillins Shortness Of Breath  and Rash  . Sulfa Antibiotics Shortness Of Breath  . Ciprofloxacin Hcl Rash  . Quetiapine Fumarate Rash    ROS: A comprehensive review of systems was negative except for: Respiratory: positive for cough and dyspnea on exertion Cardiovascular: positive for fatigue and orthopnea  HOME MEDS:   Medication List    ASK your doctor about these medications       ALPRAZolam 0.5 MG tablet  Commonly known as:  XANAX  Take 0.5 mg by mouth 3 (three) times daily as needed for anxiety.     aspirin 81 MG chewable tablet  Chew 1 tablet (81 mg total) by mouth daily.     atorvastatin 80 MG tablet  Commonly known as:  LIPITOR  Take 80 mg by mouth daily.     carvedilol 6.25 MG tablet  Commonly known as:  COREG  Take 6.25 mg by mouth 2 (two) times daily with a meal.      citalopram 40 MG tablet  Commonly known as:  CELEXA  Take 40 mg by mouth daily.     clopidogrel 75 MG tablet  Commonly known as:  PLAVIX  Take 75 mg by mouth daily.     donepezil 10 MG tablet  Commonly known as:  ARICEPT  Take 10 mg by mouth daily.     esomeprazole 40 MG capsule  Commonly known as:  NEXIUM  Take 40 mg by mouth daily before breakfast.     furosemide 20 MG tablet  Commonly known as:  LASIX  Take 1 tablet (20 mg total) by mouth daily.     insulin NPH-regular Human (70-30) 100 UNIT/ML injection  Commonly known as:  NOVOLIN 70/30  Inject 34-48 Units into the skin. 48 units in AM, 34 units in PM     isosorbide mononitrate 30 MG 24 hr tablet  Commonly known as:  IMDUR  Take 0.5 tablets (15 mg total) by mouth daily.     lisinopril 2.5 MG tablet  Commonly known as:  PRINIVIL,ZESTRIL  Take 2.5 mg by mouth daily.     nitroGLYCERIN 0.4 MG SL tablet  Commonly known as:  NITROSTAT  Place 0.4 mg under the tongue every 5 (five) minutes as needed for chest pain.     traZODone 100 MG tablet  Commonly known as:  DESYREL  Take 0.5 tablets (50 mg total) by mouth at bedtime.       LABS/IMAGING: Results for orders placed during the hospital encounter of 12/25/13 (from the past 48 hour(s))  CBC     Status: Abnormal   Collection Time    12/25/13  3:41 PM      Result Value Ref [place]   WBC 9.5  4.0 - 10.5 K/uL   RBC 3.01 (*) 3.87 - 5.11 MIL/uL   Hemoglobin 8.8 (*) 12.0 - 15.0 g/dL   HCT 27.4 (*) 36.0 - 46.0 %   MCV 91.0  78.0 - 100.0 fL   MCH 29.2  26.0 - 34.0 pg   MCHC 32.1  30.0 - 36.0 g/dL   RDW 15.7 (*) 11.5 - 15.5 %   Platelets 394  150 - 400 K/uL  BASIC METABOLIC PANEL     Status: Abnormal   Collection Time    12/25/13  3:41 PM      Result Value Ref [place]   Sodium 144  137 - 147 mEq/L   Potassium 3.3 (*) 3.7 - 5.3 mEq/L   Chloride 101  96 - 112 mEq/L   CO2 25  19 - 32 mEq/L   Glucose, Bld 60 (*) 70 - 99 mg/dL   BUN 26 (*) 6 - 23 mg/dL   Creatinine, Ser  1.42 (*) 0.50 - 1.10 mg/dL   Calcium 9.0  8.4 - 10.5 mg/dL   GFR calc non Af Amer 37 (*) >90 mL/min   GFR calc Af Amer 43 (*) >90 mL/min   Comment: (NOTE)     The eGFR has been calculated using the CKD EPI equation.     This calculation has not been validated in all clinical situations.     eGFR's persistently <90 mL/min signify possible Chronic Kidney     Disease.  PRO B NATRIURETIC PEPTIDE     Status: Abnormal   Collection Time    12/25/13  3:41 PM      Result Value Ref [place]   Pro B Natriuretic peptide (BNP) 12286.0 (*) 0 - 125 pg/mL  I-STAT TROPOININ, ED     Status: None   Collection Time    12/25/13  3:59 PM      Result Value Ref [place]   Troponin i, poc 0.03  0.00 - 0.08 ng/mL   Comment 3            Comment: Due to the release kinetics of cTnI,     a negative result within the first hours     of the onset of symptoms does not rule out     myocardial infarction with certainty.     If myocardial infarction is still suspected,     repeat the test at appropriate intervals.  CBG MONITORING, ED     Status: Abnormal   Collection Time    12/25/13  4:40 PM      Result Value Ref Range   Glucose-Capillary 63 (*) 70 - 99 mg/dL  CBG MONITORING, ED     Status: Abnormal   Collection Time    12/25/13  4:53 PM      Result Value Ref [place]   Glucose-Capillary 61 (*) 70 - 99 mg/dL   Dg Chest 2 View  12/25/2013   CLINICAL DATA:  Shortness of breath.  EXAM: CHEST  2 VIEW  COMPARISON:  12/12/2012  FINDINGS: The heart is enlarged but stable. Stable surgical changes from bypass surgery. There are chronic bronchitic type lung changes with probable superimposed bronchitis or interstitial pneumonitis. No focal airspace consolidation or pleural effusion.  IMPRESSION: Chronic underlying lung changes with probable superimposed bronchitis.   Electronically Signed   By: Kalman Jewels M.D.   On: 12/25/2013 18:45    VITALS: Filed Vitals:   12/25/13 1930  BP: 129/53  Pulse: 68  Temp:   Resp: 10     EXAM: General appearance: alert and no distress Neck: JVD - 3 cm above sternal notch, no carotid bruit and there is a ladybug tattoo over her right external jugular Lungs: diminished breath sounds LLL and rales RLL Heart: regular rate and rhythm, S1, S2 normal, no S3 or S4 and systolic murmur: holosystolic 2/6, blowing at 2nd right intercostal space Abdomen: soft, non-tender; bowel sounds normal; no masses,  no organomegaly Extremities: bilateral BKA's Pulses: 2+ and symmetric in the upper extremities Skin: Pale, warm, dry Neurologic: Mental status: Alert, oriented, thought content appropriate Psych: Normal affect  IMPRESSION: Principal Problem:   Acute on chronic systolic congestive heart failure, NYHA class 4 Active Problems:   Cardiomyopathy, ischemic   DM type 1 (diabetes mellitus, type 1)   CAD (coronary artery disease)  Hx of CABG   CKD (chronic kidney disease) stage 3, GFR 30-59 ml/min   PLAN: 1. Mrs. Smart was recently hospitalized for 1 week in [place] for pneumonia, sepsis, acute renal failure (creatinine up to 5) and had an NSTEMI. EF is now 40-45% with inferior wall motion abnormalities. She was treated with IV antibiotics and hydrated. Creatinine on 3/30 was 1.7.  BNP was elevated to ~2500.  Her family did not feel she was adequately treated and she was brought to [facility]. Pro-BNP here is elevated, however, renal function appears somewhat better. On exam, clinically her CHF is mild. She had moderate to severe TR on her echo with moderate pulmonary hypertension - I suspect this is a load-dependent finding. 2. I would recommend admission for diuresis. 40 IV BID should be sufficient. Follow creatinine closely as her BNP may be elevated out of proportion to her CHF. 3. I do not feel she is a good cath candidate. She has recurrent NSTEMI which is hemodynamically mediated, suggesting underlying obstruction is almost certain. This means she is at risk for more myocardial damage  and worsening EF. On the other hand, she has very tenuous kidneys and may progress to need dialysis with a dye load.  She has not really complained of angina. 4. Her pneumonia appears to be resolved. There may be some bronchitis on CXR, but no pleural effusion.  Pixie Casino, MD, [facility] Attending Cardiologist [place] 12/25/2013, 8:12 PM

## 2013-12-25 NOTE — ED Notes (Signed)
EDP notified of CBG, pt given 2 cups of apple juice at this time. Will reassess.

## 2013-12-25 NOTE — ED Notes (Signed)
IV Attempted x1. Reino Bellis at the bedside attempting to start IV to give medication.

## 2013-12-25 NOTE — ED Notes (Signed)
Per pt sts SOB since released from [facility] was treated for PNA. Pt tachypnea. Hx of CHF. sts swelling in extremities.

## 2013-12-25 NOTE — ED Notes (Signed)
Pt's skin color is better at this time. Alert and oriented, skin dry. States that she is feeling better. X-ray called for transport

## 2013-12-25 NOTE — ED Provider Notes (Signed)
CSN: [id number]     Arrival date & time 12/25/13  1529 History   First MD Initiated Contact with Patient 12/25/13 1641     Chief Complaint  Patient presents with  . Shortness of Breath     (Consider location/radiation/quality/duration/timing/severity/associated sxs/prior Treatment) Patient is a 68 y.o. female presenting with shortness of breath. The history is provided by the patient and a relative.  Shortness of Breath  patient here complaining of increased dyspnea as well as orthopnea with intermittent chest discomfort lasting for 1-2 minutes. She has had a nonproductive cough. No fever or chills. Was admitted for pneumonia 2 weeks ago. Notes increased total body swelling. Saw her physician today who diagnosed her with congestive heart failure and recommended admission to the hospital. She has no prior history of CHF. No medications given prior to arrival. Denies any vomiting or diarrhea. Denies any pleuritic chest pain.  Past Medical History  Diagnosis Date  . Coronary artery disease     a. s/p prior MI;  b. CABG 1999 @ [facility]; c. s/p stenting 2009 or 10.  Marland Kitchen Hypertension   . Anxiety   . Depression   . Dementia   . Diabetes mellitus with complication     a. 12/84 DKA  . GERD (gastroesophageal reflux disease)     Bilateral BKAs  . H/O hiatal hernia   . Headache(784.0)   . Arthritis   . Anemia   . Cancer     L breast cancer  . Pulmonary embolism     a. prev coumadin  . Enterococcus UTI 12/19/2012  . Cardiomyopathy, ischemic 12/12/2012    Ejection fraction 45%.  . DKA (diabetic ketoacidoses) 12/12/2012  . Encephalopathy acute 12/12/2012  . Thrombocytopenia 12/12/2012  . NSTEMI (non-ST elevated myocardial infarction) 12/12/2012  . Peripheral vascular disease     Status post bilateral BKA   Past Surgical History  Procedure Laterality Date  . Coronary angioplasty    . Back surgery    . Coronary artery bypass graft    . Appendectomy    . Abdominal hysterectomy    . Mastectomy     . Joint replacement      Hip  . Breast surgery      left  . Fracture surgery      R femur  . Cholecystectomy    . Tonsillectomy    . Leg amputation below knee Bilateral    History reviewed. No pertinent family history. History  Substance Use Topics  . Smoking status: Former Research scientist (life sciences)  . Smokeless tobacco: Never Used     Comment: prev tob, quit 1999  . Alcohol Use: No   OB History   Grav Para Term Preterm Abortions TAB SAB Ect Mult Living                 Review of Systems  Respiratory: Positive for shortness of breath.   All other systems reviewed and are negative.     Allergies  Ciprofloxacin hcl; Nitrofuran derivatives; Penicillins; Quetiapine fumarate; and Sulfa antibiotics  Home Medications   Current Outpatient Rx  Name  Route  Sig  Dispense  Refill  . ALPRAZolam (XANAX) 0.5 MG tablet   Oral   Take 0.5 mg by mouth 2 (two) times daily as needed for anxiety.         Marland Kitchen aspirin 81 MG chewable tablet   Oral   Chew 1 tablet (81 mg total) by mouth daily.         Marland Kitchen atorvastatin (LIPITOR)  80 MG tablet   Oral   Take 1 tablet (80 mg total) by mouth daily. For treatment of your high cholesterol and heart attack. (The dose was increased.)   30 tablet   3   . carvedilol (COREG) 6.25 MG tablet   Oral   Take 1 tablet (6.25 mg total) by mouth 2 (two) times daily with a meal. Medication for your heart and blood pressure.   60 tablet   3   . citalopram (CELEXA) 40 MG tablet   Oral   Take 40 mg by mouth daily.         . clopidogrel (PLAVIX) 75 MG tablet   Oral   Take 75 mg by mouth daily.         Marland Kitchen donepezil (ARICEPT) 10 MG tablet   Oral   Take 10 mg by mouth at bedtime.         Marland Kitchen esomeprazole (NEXIUM) 40 MG capsule   Oral   Take 40 mg by mouth daily before breakfast.         . furosemide (LASIX) 20 MG tablet   Oral   Take 1 tablet (20 mg total) by mouth daily.         . insulin NPH-regular (NOVOLIN 70/30) (70-30) 100 UNIT/ML injection    Subcutaneous   Inject 34-48 Units into the skin. 48 units in AM, 34 units in PM         . isosorbide mononitrate (IMDUR) 30 MG 24 hr tablet   Oral   Take 0.5 tablets (15 mg total) by mouth daily.         Marland Kitchen levofloxacin (LEVAQUIN) 500 MG tablet   Oral   Take 1 tablet (500 mg total) by mouth daily. Antibiotic.   6 tablet   0   . lisinopril (PRINIVIL,ZESTRIL) 2.5 MG tablet   Oral   Take 2.5 mg by mouth daily.         . nitroGLYCERIN (NITROSTAT) 0.4 MG SL tablet   Sublingual   Place 0.4 mg under the tongue every 5 (five) minutes as needed for chest pain.         . traZODone (DESYREL) 100 MG tablet   Oral   Take 0.5 tablets (50 mg total) by mouth at bedtime.          BP 140/60  Pulse 67  Temp(Src) 98.9 F (37.2 C) (Oral)  Resp 19  Ht 5\' 3"  (1.6 m)  Wt 165 lb (74.844 kg)  BMI 29.24 kg/m2  SpO2 100% Physical Exam  Nursing note and vitals reviewed. Constitutional: She is oriented to person, place, and time. She appears well-developed and well-nourished.  Non-toxic appearance. No distress.  HENT:  Head: Normocephalic and atraumatic.  Eyes: Conjunctivae, EOM and lids are normal. Pupils are equal, round, and reactive to light.  Neck: Normal range of motion. Neck supple. No tracheal deviation present. No mass present.  Cardiovascular: Regular rhythm and normal heart sounds.  Tachycardia present.  Exam reveals no gallop.   No murmur heard. Pulmonary/Chest: Effort normal. No stridor. No respiratory distress. She has decreased breath sounds. She has no wheezes. She has rhonchi. She has no rales.  Abdominal: Soft. Normal appearance and bowel sounds are normal. She exhibits no distension. There is no tenderness. There is no rebound and no CVA tenderness.  Musculoskeletal: Normal range of motion. She exhibits no edema and no tenderness.  Bilateral lower extremity amputations with prosthetic limbs noted  Neurological: She is alert and oriented to  person, place, and time. She  has normal strength. No cranial nerve deficit or sensory deficit. GCS eye subscore is 4. GCS verbal subscore is 5. GCS motor subscore is 6.  Skin: Skin is warm and dry. No abrasion and no rash noted.  Psychiatric: She has a normal mood and affect. Her speech is normal and behavior is normal.    ED Course  Procedures (including critical care time) Labs Review Labs Reviewed  CBC - Abnormal; Notable for the following:    RBC 3.01 (*)    Hemoglobin 8.8 (*)    HCT 27.4 (*)    RDW 15.7 (*)    All other components within normal limits  BASIC METABOLIC PANEL - Abnormal; Notable for the following:    Potassium 3.3 (*)    Glucose, Bld 60 (*)    BUN 26 (*)    Creatinine, Ser 1.42 (*)    GFR calc non Af Amer 37 (*)    GFR calc Af Amer 43 (*)    All other components within normal limits  PRO B NATRIURETIC PEPTIDE - Abnormal; Notable for the following:    Pro B Natriuretic peptide (BNP) 12286.0 (*)    All other components within normal limits  CBG MONITORING, ED - Abnormal; Notable for the following:    Glucose-Capillary 63 (*)    All other components within normal limits  CBG MONITORING, ED - Abnormal; Notable for the following:    Glucose-Capillary 61 (*)    All other components within normal limits  I-STAT TROPOININ, ED   Imaging Review No results found.   EKG Interpretation   Date/Time:  Wednesday December 25 2013 15:36:34 EDT Ventricular Rate:  72 PR Interval:  222 QRS Duration: 140 QT Interval:  468 QTC Calculation: 512 R Axis:   115 Text Interpretation:  Sinus rhythm with 1st degree A-V block Right axis  deviation Non-specific intra-ventricular conduction block Cannot rule out  Septal infarct , age undetermined T wave abnormality, consider inferior  ischemia Abnormal ECG No significant change since last tracing Confirmed  by Kashvi Prevette  MD, Naiomy Watters ([id number]) on 12/25/2013 4:42:23 PM      MDM   Final diagnoses:  None    Patient given Lasix 60 mg IV push. She is mildly  hypokalemic will be given 40 mEq of potassium orally. Patient to be admitted to cardiology.    Leota Jacobsen, MD 12/25/13 [phone]

## 2013-12-26 LAB — TROPONIN I
Troponin I: <0.3 ng/mL (ref <0.30)
Troponin I: <0.3 ng/mL (ref <0.30)

## 2013-12-26 LAB — BASIC METABOLIC PANEL
BUN: 22 mg/dL (ref 6–23)
CO2: 27 mEq/L (ref 19–32)
CREATININE: 1.34 mg/dL — AB (ref 0.50–1.10)
Calcium: 9 mg/dL (ref 8.4–10.5)
Chloride: 104 mEq/L (ref 96–112)
GFR calc Af Amer: 46 mL/min — ABNORMAL LOW (ref >90)
GFR calc non Af Amer: 39 mL/min — ABNORMAL LOW (ref >90)
GLUCOSE: 106 mg/dL — AB (ref 70–99)
POTASSIUM: 4 meq/L (ref 3.7–5.3)
Sodium: 147 mEq/L (ref 137–147)

## 2013-12-26 LAB — GLUCOSE, CAPILLARY
GLUCOSE-CAPILLARY: 244 mg/dL — AB (ref 70–99)
Glucose-Capillary: 110 mg/dL — ABNORMAL HIGH (ref 70–99)
Glucose-Capillary: 174 mg/dL — ABNORMAL HIGH (ref 70–99)
Glucose-Capillary: 122 mg/dL — ABNORMAL HIGH (ref 70–99)

## 2013-12-26 LAB — CREATININE, SERUM
Creatinine, Ser: 1.4 mg/dL — ABNORMAL HIGH (ref 0.50–1.10)
GFR, EST AFRICAN AMERICAN: 43 mL/min — AB (ref >90)
GFR, EST NON AFRICAN AMERICAN: 37 mL/min — AB (ref >90)

## 2013-12-26 LAB — CBC
HCT: 26.4 % — ABNORMAL LOW (ref 36.0–46.0)
HEMOGLOBIN: 8.3 g/dL — AB (ref 12.0–15.0)
MCH: 29.4 pg (ref 26.0–34.0)
MCHC: 31.4 g/dL (ref 30.0–36.0)
MCV: 93.6 fL (ref 78.0–100.0)
Platelets: 340 10*3/uL (ref 150–400)
RBC: 2.82 MIL/uL — ABNORMAL LOW (ref 3.87–5.11)
RDW: 15.7 % — ABNORMAL HIGH (ref 11.5–15.5)
WBC: 7.3 10*3/uL (ref 4.0–10.5)

## 2013-12-26 MED ORDER — ACETAMINOPHEN 325 MG PO TABS
650.0000 mg | ORAL_TABLET | Freq: Four times a day (QID) | ORAL | Status: DC | PRN
  Administered 2013-12-26 – 2014-01-04 (×13): 650 mg via ORAL
  Filled 2013-12-26 (×12): qty 2

## 2013-12-26 NOTE — Progress Notes (Signed)
Utilization Review Completed Jhada Risk J. Cleotha Whalin, RN, BSN, NCM [phone]  

## 2013-12-26 NOTE — Progress Notes (Signed)
Subjective: No CP.  Slept on an incline.  Objective: Vital signs in last 24 hours: Temp:  [98.2 F (36.8 C)-98.9 F (37.2 C)] 98.4 F (36.9 C) (04/09 0651) Pulse Rate:  [65-72] 71 (04/09 0651) Resp:  [10-25] 18 (04/09 0651) BP: (123-144)/(51-68) 131/52 mmHg (04/09 0651) SpO2:  [98 %-100 %] 100 % (04/09 0651) Weight:  [154 lb (69.854 kg)-165 lb (74.844 kg)] 154 lb (69.854 kg) (04/09 0651) Last BM Date: 12/25/13  Intake/Output from previous day: 04/08 0701 - 04/09 0700 In: 300 [P.O.:300] Out: 3025 [Urine:3025] Intake/Output this shift:    Medications Current Facility-Administered Medications  Medication Dose Route Frequency Provider Last Rate Last Dose  . 0.9 %  sodium chloride infusion  250 mL Intravenous PRN Rogelia Mire, NP      . acetaminophen (TYLENOL) tablet 650 mg  650 mg Oral Q6H PRN Theressa Stamps, MD   650 mg at 12/26/13 0739  . ALPRAZolam Duanne Moron) tablet 0.5 mg  0.5 mg Oral TID PRN Rogelia Mire, NP   0.5 mg at 12/26/13 0037  . aspirin chewable tablet 81 mg  81 mg Oral Daily Rogelia Mire, NP      . atorvastatin (LIPITOR) tablet 80 mg  80 mg Oral Daily Rogelia Mire, NP      . carvedilol (COREG) tablet 6.25 mg  6.25 mg Oral BID WC Rogelia Mire, NP   6.25 mg at 12/26/13 0739  . citalopram (CELEXA) tablet 40 mg  40 mg Oral Daily Rogelia Mire, NP      . clopidogrel (PLAVIX) tablet 75 mg  75 mg Oral Q breakfast Rogelia Mire, NP   75 mg at 12/26/13 0739  . donepezil (ARICEPT) tablet 10 mg  10 mg Oral Daily Rogelia Mire, NP      . furosemide (LASIX) injection 40 mg  40 mg Intravenous BID Rogelia Mire, NP   40 mg at 12/26/13 0824  . heparin injection 5,000 Units  5,000 Units Subcutaneous 3 times per day Rogelia Mire, NP   5,000 Units at 12/26/13 0740  . insulin aspart (novoLOG) injection 0-15 Units  0-15 Units Subcutaneous TID WC Rogelia Mire, NP      . insulin aspart protamine- aspart (NOVOLOG MIX  70/30) injection 34 Units  34 Units Subcutaneous Q supper Pixie Casino, MD      . insulin aspart protamine- aspart (NOVOLOG MIX 70/30) injection 48 Units  48 Units Subcutaneous Q breakfast Rogelia Mire, NP      . isosorbide mononitrate (IMDUR) 24 hr tablet 15 mg  15 mg Oral Daily Rogelia Mire, NP      . lisinopril (PRINIVIL,ZESTRIL) tablet 2.5 mg  2.5 mg Oral Daily Rogelia Mire, NP      . nitroGLYCERIN (NITROSTAT) SL tablet 0.4 mg  0.4 mg Sublingual Q5 min PRN Rogelia Mire, NP      . pantoprazole (PROTONIX) EC tablet 40 mg  40 mg Oral Daily Rogelia Mire, NP      . potassium chloride SA (K-DUR,KLOR-CON) CR tablet 40 mEq  40 mEq Oral BID Rogelia Mire, NP   40 mEq at 12/26/13 0035  . sodium chloride 0.9 % injection 3 mL  3 mL Intravenous Q12H Rogelia Mire, NP   3 mL at 12/26/13 0036  . sodium chloride 0.9 % injection 3 mL  3 mL Intravenous PRN Rogelia Mire, NP      . traZODone (DESYREL) tablet  50 mg  50 mg Oral QHS Rogelia Mire, NP   50 mg at 12/26/13 0028    PE: General appearance: alert, cooperative and no distress Neck: I did not see any JVD. Lungs: Bilateral Rales Heart: regular rate and rhythm, S1, S2 normal, no murmur, click, rub or gallop Abdomen: +BS, Nontender Pulses: Radials 2+  Skin: Warm and dry Neurologic: Grossly normal  Lab Results:   Recent Labs  12/25/13 1541 12/26/13 0045  WBC 9.5 7.3  HGB 8.8* 8.3*  HCT 27.4* 26.4*  PLT 394 340   BMET  Recent Labs  12/25/13 1541 12/26/13 0045 12/26/13 0600  NA 144  --  147  K 3.3*  --  4.0  CL 101  --  104  CO2 25  --  27  GLUCOSE 60*  --  106*  BUN 26*  --  22  CREATININE 1.42* 1.40* 1.34*  CALCIUM 9.0  --  9.0   PT/INR No results found for this basename: LABPROT, INR,  in the last 72 hours Cholesterol No results found for this basename: CHOL,  in the last 72 hours Cardiac Enzymes No components found with this basename: TROPONIN,  CKMB,    Studies/Results: @RISRSLT2 @   Assessment/Plan  68 y.o. female with a past medical history significant for CAD (CABG in 1999 at [place]), history of PE, HTN, DM2, GERD, L Breast CA, and dementia. She believes she was last intervened upon sometime in 2009 or 2010, in [place]. She also has a h/o DM and PVD and is s/p bilat BKA's, which limits activity. She does experience periodic chest pain, usually at rest, approx 1x every few mos. She rarely has to use sl NTG. She has a long h/o colitis. She was seen in 12/12/2012 in consult for NSTEMI/CAD in the setting of colitis and diarrhea. Due to her CKD, catheterization was not recommended. She had an echocardiogram which showed an EF of 45%, with inferior and inferolateral hypokinesis. She had not complained of chest pain. 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 She was just discharged from [facility] for pneumonia, sepsis, acute renal failure up to creatinine of 5. She also had NSTEMI with troponin that peaked just over 2. No intervention was performed. An echo was repeated which shows the EF is now 40-45% with clear inferior wall motion abnormalities. After discharge, her family did not feel that she was adequately treated and drove her to [facility] for care. Her BNP on admission now is 12,286. Creatinine is 1.42. She denies chest pain and has mild shortness of breath.  Principal Problem:   Acute on chronic systolic congestive heart failure, NYHA class 4 Net fluids: -2.7L.  Lasix 40mg  IV bid.  SCr improved slightly. 1.34.  She still sounds volume overloaded.  Continue current lasix dosing.   Active Problems:   Cardiomyopathy, ischemic EF 40-45%   DM type 1 (diabetes mellitus, type 1)   CAD (coronary artery disease) Recent NSTEMI in [place] with inferior wall motion abn.  Per Dr. Debara Pickett, not a good cath candidate with   CKD issues.     Hx of CABG   CKD (chronic kidney disease) stage 3, GFR 30-59 ml/min  SCr improved slightly.  1.34.      LOS: 1 day    Tarri Fuller PA-C 12/26/2013 8:45 AM  Patient seen, examined. Available data reviewed. Agree with findings, assessment, and plan as outlined by Tarri Fuller, PA-C. Exam reveals an alert, oriented woman with a somewhat depressed affect.  Lungs show bibasilar crackles but overall good air movement. Heart is regular rate and rhythm. Abdomen is soft and nontender. Extremities: Bilateral lower extremity amputations.  I have reviewed extensive records from her recent hospitalization in [place]. She has had a good diuresis with IV Lasix. While she did have acute renal failure, her kidney function has stabilized and I think we should push IV diuretics again today. Overall she appears to be breathing comfortably and I think she is making slow improvement. She has been ill for a long time and I tried to give her reasonable expectations about what we will be able to accomplish here in the hospital. I would anticipate that she would be ready for discharge in about 48 hours if she continues to make good progress. I don't think there are any acute ischemic issues and would not recommend pursuing cardiac catheterization. Will continue to work on her congestive heart failure medical regimen. We had extensive discussion today about fluid restriction, sodium restriction, and the importance of at least low level exercise understanding that she has significant limitations from her bilateral amputations. I'm going to ask physical therapy to see her while she is here in the hospital.  Sherren Mocha, M.D. 12/26/2013 10:51 AM

## 2013-12-26 NOTE — Care Management Note (Signed)
    Page 1 of 1   01/01/2014     2:32:11 PM   CARE MANAGEMENT NOTE 01/01/2014  Patient:  Christine Hughes, Christine Hughes   Account Number:  [id number]  Date Initiated:  12/26/2013  Documentation initiated by:  Fuller Mandril  Subjective/Objective Assessment:   68 yo female with a PMHx significant for CAD (CABG in 1999 at [place]), history of PE, HTN, DM2, GERD, L Breast CA, and dementia. Admitted with SOB //Home with daughter     Action/Plan:   IV diureses//Access for Home Health needs   Anticipated DC Date:  12/29/2013   Anticipated DC Plan:  [place]  CM consult      Choice offered to / List presented to:             Status of service:  In process, will continue to follow Medicare Important Message given?   (If response is "NO", the following Medicare IM given date fields will be blank) Date Medicare IM given:   Date Additional Medicare IM given:    Discharge Disposition:    Per UR Regulation:  Reviewed for med. necessity/level of care/duration of stay  If discussed at [place] of Stay Meetings, dates discussed:   12/31/2013    Comments:  01/01/14 [place], RN, BSN, NCM [phone] Pt currently receiving personal care services through: [facility] Address: [address] [place] [address] [phone] and feels they provide adequate care for her needs.  Spoke with daughter, Christine Hughes via telephone and she is concerned that mom should not return home.  States that mom has had multiple falls and feels it unsafe.  Wants MD to give her her call (sticky not left for MD).

## 2013-12-26 NOTE — Evaluation (Signed)
Physical Therapy Evaluation Patient Details Name: Christine Hughes MRN: [id number] DOB: 1946/01/07 Today's Date: 12/26/2013   History of Present Illness  Adm with CHF. Pertinent PMHx- bil BKA due to DM; Rt hip THR, Rt femur fx (due to fall); CAD; dementia; depression  Clinical Impression  Patient with very flat affect and not interested in engaging in functional mobility or increasing her overall activity level due to reports of severe Rt hip pain with any movement. She reports she has an appointment in [place] at a pain clinic in one month due to high risk for surgery to replace hip (again). Pt does admit to at least one fall every 4-6 weeks and feels she "gets off-balance" when up with her prostheses. She is agreeable to HHPT for safety evaluation in her home environment. She does not want to proceed with acute PT.     Follow Up Recommendations Home health PT;Supervision - Intermittent    Equipment Recommendations  None recommended by PT    Recommendations for Other Services       Precautions / Restrictions Precautions Precautions: Fall Required Braces or Orthoses: Other Brace/Splint Other Brace/Splint: bil LE prostheses      Mobility  Bed Mobility Overal bed mobility: Modified Independent             General bed mobility comments: rolling, scooting up to [facility] with use of rails  Transfers                 General transfer comment: pt refused to don prostheses even when she needed to have a BM and BSC obtained; nurse tech assisted pt with bedpan per her request  Ambulation/Gait                Stairs            Wheelchair Mobility    Modified Rankin (Stroke Patients Only)       Balance                                             Pertinent Vitals/Pain Chronic severe Rt hip pain due to arthritis    Home Living Family/patient expects to be discharged to:: Private residence Living Arrangements: Alone Available Help at Discharge:  Family;Available PRN/intermittently Type of Home: Apartment Home Access: Level entry     Home Layout: One level Home Equipment: Walker - 2 wheels;Shower seat;Grab bars - toilet;Grab bars - tub/shower;Wheelchair - Education administrator (comment) (working on approval for a "hover-round" per pt)      Prior Function Level of Independence: Needs assistance   Gait / Transfers Assistance Needed: non-ambulatory x 1 yr due to "bone on metal" hip pain; uses bil prostheses to transfer to/from w/c; can propel herself however is 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  ADL's / Homemaking Assistance Needed: modified independent with ADLs; assist with homemaking and shopping        Hand Dominance        Extremity/Trunk Assessment   Upper Extremity Assessment: Overall WFL for tasks assessed           Lower Extremity Assessment:  (not assessed; pt unwilling)         Communication   Communication: No difficulties  Cognition Arousal/Alertness: Awake/alert Behavior During Therapy: Flat affect Overall Cognitive Status: No family/caregiver present to determine baseline cognitive functioning  General Comments General comments (skin integrity, edema, etc.): Lengthy discussion re: role of PT. Pt admits to falling ~once per month. Denies dizziness or lightheadedness. "Just get off-balance." Unsure if these falls occur mostly during her morning routine, but thinks they may. Pt is willing to have HHPT to assess her safety at home at w/c level (she was ambulatory the last time she had HHPT). We discussed at length trying to get her more active to assist with her arthritis pain and control of her CHF and DM. Pt not interested in increasing her functional mobility due to Rt hip pain (repeatedly mentioned she has been referred to a pain clinic and has an appt in one month). She has general UE exercises she does in the evenings. Discussed trying these exercises in the morning to prepare her body  for movement and perhaps decr fall risk and pt hesitant to commit to trying this change.     Exercises Other Exercises Other Exercises: Pt demonstrated UE AROM exercises she performs: overhead press (no weights), shoulder flexion, elbow flexion.      Assessment/Plan    PT Assessment All further PT needs can be met in the next venue of care  PT Diagnosis Difficulty walking   PT Problem List Decreased strength;Decreased balance;Other (comment) (h/o multiple falls)  PT Treatment Interventions     PT Goals (Current goals can be found in the Care Plan section) Acute Rehab PT Goals Patient Stated Goal: not sure she wants to change anything; agreed wants to fall less PT Goal Formulation: No goals set, d/c therapy    Frequency     Barriers to discharge        Co-evaluation               End of Session Equipment Utilized During Treatment: Other (comment) (obtained BSC for pt's room/use) Activity Tolerance: Other (comment);Patient limited by fatigue (pt refused most activity) Patient left: in bed;with call bell/phone within reach;with nursing/sitter in room           Time: [phone] PT Time Calculation (min): 34 min   Charges:   PT Evaluation $Initial PT Evaluation Tier I: 1 Procedure PT Treatments $Therapeutic Exercise: 8-22 mins $Self Care/Home Management: 8-22   PT G CodesJeanie Cooks Jhony [place] 01-06-14, 4:50 PM Pager [phone]

## 2013-12-26 NOTE — Consult Note (Addendum)
Heart Failure Navigator Consult Note  Presentation: Christine Hughes  is a 67 y.o. female with a past medical history significant for CAD (CABG in 1999 at [place]), history of PE, HTN, DM2, GERD, L Breast CA, and dementia. She believes she was last intervened upon sometime in 2009 or 2010, in [place]. She also has a h/o DM and PVD and is s/p bilat BKA's, which limits activity. She does experience periodic chest pain, usually at rest, approx 1x every few mos. She rarely has to use sl NTG. She has a long h/o colitis. She was seen in 12/12/2012 in consult for NSTEMI/CAD in the setting of colitis and diarrhea. Due to her CKD, catheterization was not recommended. She had an echocardiogram which showed an EF of 45%, with inferior and inferolateral hypokinesis. She had not complained of chest pain. 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 She was just discharged from [facility] for pneumonia, sepsis, acute renal failure up to creatinine of 5. She also had NSTEMI with troponin that peaked just over 2. No intervention was performed. An echo was repeated which shows the EF is now 40-45% with clear inferior wall motion abnormalities. After discharge, her family did not feel that she was adequately treated and drove her to [facility] for care. Her BNP on admission now is 12,286. Creatinine is 1.42. She denies chest pain and has mild shortness of breath.    Past Medical History  Diagnosis Date  . Coronary artery disease     a. s/p prior MI;  b. CABG 1999 @ [facility]; c. s/p stenting 2009 or 10.  Marland Kitchen Hypertension   . Anxiety   . Depression   . Dementia   . Diabetes mellitus with complication     a. 02/629 DKA  . GERD (gastroesophageal reflux disease)     Bilateral BKAs  . H/O hiatal hernia   . Headache(784.0)   . Arthritis   . Anemia   . Cancer     L breast cancer  . Pulmonary embolism     a. prev coumadin  . Enterococcus UTI 12/19/2012  . Cardiomyopathy, ischemic 12/12/2012    Ejection  fraction 45%.  . DKA (diabetic ketoacidoses) 12/12/2012  . Encephalopathy acute 12/12/2012  . Thrombocytopenia 12/12/2012  . NSTEMI (non-ST elevated myocardial infarction) 12/12/2012  . Peripheral vascular disease     Status post bilateral BKA    History   Social History  . Marital Status: Unknown    Spouse Name: N/A    Number of Children: N/A  . Years of Education: N/A   Social History Main Topics  . Smoking status: Former Research scientist (life sciences)  . Smokeless tobacco: Never Used     Comment: prev tob, quit 1999  . Alcohol Use: No  . Drug Use: No  . Sexual Activity: Not Currently   Other Topics Concern  . None   Social History Narrative   Lives at [facility]     ECHO:Study Conclusions--12/12/13  - Left ventricle: Techncially severely limited. Moderate hypokinesis of inferior and inferolateral walls. EF probably 45%, but can not see apex well. The cavity size was normal. - Aortic valve: Sclerosis without stenosis. No significant regurgitation. - Mitral valve: Mild regurgitation. - Left atrium: The atrium was mildly dilated. - Right ventricle: Not able to assess RV function due to poor visualization. I suspect some RV dysfunction. The cavity size was mildly dilated. - Tricuspid valve: Poorly visualized. Mild regurgitation   BNP    Component Value Date/Time  PROBNP 12286.0* 12/25/2013 1541    Education Assessment and Provision:  Detailed education and instructions provided on heart failure disease management including the following:  Signs and symptoms of Heart Failure When to call the physician Importance of daily weights Low sodium diet  Fluid restriction Medication management Anticipated future follow-up appointments  Patient education given on each of the above topics.  Patient acknowledges understanding.   She was quite frustrated with "everything being thrown at her".  She verbalized that she was unsure if she will be able to comply with recommendations.  I  encouraged that she should take it one thing at a time.  She will need much more education and compliance reinforcement after discharge.  Education Materials:  "Living Better With Heart Failure" Booklet, Daily Weight Tracker Tool and Heart Failure Educational Video.   High Risk Criteria for Readmission and/or Poor Patient Outcomes:    EF <30%- No--45%  2 or more admissions in 6 months- No  Difficult social situation- No--However lives alone and has limited mobility secondary to bilat. amputation  Demonstrates medication noncompliance- No    Barriers of Care:  Knowledge of medical conditions, ability/desire to be compliant.  Discharge Planning:   Discharge home alone.  She will need much more education and compliance reinforcement after discharge.  She seems to be in denial presently and has poor insight into her health.

## 2013-12-27 DIAGNOSIS — I10 Essential (primary) hypertension: Secondary | ICD-10-CM | POA: Diagnosis present

## 2013-12-27 DIAGNOSIS — E785 Hyperlipidemia, unspecified: Secondary | ICD-10-CM | POA: Diagnosis present

## 2013-12-27 DIAGNOSIS — I214 Non-ST elevation (NSTEMI) myocardial infarction: Secondary | ICD-10-CM

## 2013-12-27 LAB — BASIC METABOLIC PANEL
BUN: 21 mg/dL (ref 6–23)
CO2: 28 meq/L (ref 19–32)
Calcium: 9.1 mg/dL (ref 8.4–10.5)
Chloride: 103 mEq/L (ref 96–112)
Creatinine, Ser: 1.43 mg/dL — ABNORMAL HIGH (ref 0.50–1.10)
GFR calc Af Amer: 42 mL/min — ABNORMAL LOW (ref >90)
GFR calc non Af Amer: 36 mL/min — ABNORMAL LOW (ref >90)
GLUCOSE: 73 mg/dL (ref 70–99)
Potassium: 4.4 mEq/L (ref 3.7–5.3)
SODIUM: 145 meq/L (ref 137–147)

## 2013-12-27 LAB — GLUCOSE, CAPILLARY
GLUCOSE-CAPILLARY: 77 mg/dL (ref 70–99)
GLUCOSE-CAPILLARY: 261 mg/dL — AB (ref 70–99)
Glucose-Capillary: 150 mg/dL — ABNORMAL HIGH (ref 70–99)
Glucose-Capillary: 127 mg/dL — ABNORMAL HIGH (ref 70–99)

## 2013-12-27 LAB — PRO B NATRIURETIC PEPTIDE: Pro B Natriuretic peptide (BNP): 9076 pg/mL — ABNORMAL HIGH (ref 0–125)

## 2013-12-27 LAB — TROPONIN I

## 2013-12-27 MED ORDER — INSULIN ASPART PROT & ASPART (70-30 MIX) 100 UNIT/ML ~~LOC~~ SUSP
48.0000 [IU] | Freq: Every day | SUBCUTANEOUS | Status: DC
  Administered 2013-12-28 – 2014-01-04 (×5): 48 [IU] via SUBCUTANEOUS
  Filled 2013-12-27: qty 10

## 2013-12-27 MED ORDER — CARVEDILOL 6.25 MG PO TABS
6.2500 mg | ORAL_TABLET | Freq: Two times a day (BID) | ORAL | Status: DC
  Administered 2013-12-27 – 2014-01-04 (×13): 6.25 mg via ORAL
  Filled 2013-12-27 (×21): qty 1

## 2013-12-27 MED ORDER — CLOPIDOGREL BISULFATE 75 MG PO TABS
75.0000 mg | ORAL_TABLET | Freq: Every day | ORAL | Status: DC
  Administered 2013-12-27 – 2013-12-30 (×4): 75 mg via ORAL
  Filled 2013-12-27 (×6): qty 1

## 2013-12-27 MED ORDER — SODIUM CHLORIDE 0.9 % IV SOLN
INTRAVENOUS | Status: DC
  Administered 2013-12-30: 04:00:00 via INTRAVENOUS

## 2013-12-27 NOTE — Progress Notes (Signed)
Patient Name: Christine Hughes Date of Encounter: 12/27/2013   Principal Problem:   Acute on chronic systolic congestive heart failure, NYHA class 4 Active Problems:   Cardiomyopathy, ischemic   CAD (coronary artery disease)   Hx of CABG   DM type 1 (diabetes mellitus, type 1)   CKD (chronic kidney disease) stage 3, GFR 30-59 ml/min   Anemia   Depression with anxiety   SUBJECTIVE  Says that she's been having 7-8/10 c/p x 4 hrs this morning.  Did not notify nurse.  Dtr called nurse and notified her.  ECG pending.  Breathing not much improved.  Remains orthopneic.  -5.7L yesterday (6L total for admission).  Wt down to 152 from 156 on admission.  CURRENT MEDS . aspirin  81 mg Oral Daily  . atorvastatin  80 mg Oral Daily  . carvedilol  6.25 mg Oral BID WC  . citalopram  40 mg Oral Daily  . clopidogrel  75 mg Oral Q breakfast  . donepezil  10 mg Oral Daily  . furosemide  40 mg Intravenous BID  . heparin  5,000 Units Subcutaneous 3 times per day  . insulin aspart  0-15 Units Subcutaneous TID WC  . insulin aspart protamine- aspart  34 Units Subcutaneous Q supper  . insulin aspart protamine- aspart  48 Units Subcutaneous Q breakfast  . isosorbide mononitrate  15 mg Oral Daily  . lisinopril  2.5 mg Oral Daily  . pantoprazole  40 mg Oral Daily  . potassium chloride  40 mEq Oral BID  . sodium chloride  3 mL Intravenous Q12H  . traZODone  50 mg Oral QHS    OBJECTIVE  Filed Vitals:   12/26/13 0651 12/26/13 1415 12/26/13 2058 12/27/13 0548  BP: 131/52 121/55 128/50 124/48  Pulse: 71 65 67 71  Temp: 98.4 F (36.9 C) 99.4 F (37.4 C) 99.3 F (37.4 C) 99 F (37.2 C)  TempSrc: Oral Oral Oral Oral  Resp: 18 18 18 17   Height:      Weight: 154 lb (69.854 kg)   152 lb 6.4 oz (69.128 kg)  SpO2: 100% 99% 99% 100%    Intake/Output Summary (Last 24 hours) at 12/27/13 1130 Last data filed at 12/27/13 0912  Gross per 24 hour  Intake    720 ml  Output   3650 ml  Net  -2930 ml    Filed Weights   12/25/13 2246 12/26/13 0651 12/27/13 0548  Weight: 156 lb (70.761 kg) 154 lb (69.854 kg) 152 lb 6.4 oz (69.128 kg)    PHYSICAL EXAM  General: Pleasant, mildy tachypneic. Neuro: Alert and oriented X 3. Moves all extremities spontaneously. Psych: Flat affect. HEENT:  Normal  Neck: Supple without bruits.  Neck veins mildly elevated. Lungs:  Resp regular and unlabored, bibasilar crackles. Heart: RRR, distant, no s3, s4, or murmurs. Abdomen: Soft, non-tender, non-distended, BS + x 4.  Extremities: No clubbing, cyanosis or edema. Bilat BKA's.  Accessory Clinical Findings  CBC  Recent Labs  12/25/13 1541 12/26/13 0045  WBC 9.5 7.3  HGB 8.8* 8.3*  HCT 27.4* 26.4*  MCV 91.0 93.6  PLT 394 195   Basic Metabolic Panel  Recent Labs  12/26/13 0600 12/27/13 0500  NA 147 145  K 4.0 4.4  CL 104 103  CO2 27 28  GLUCOSE 106* 73  BUN 22 21  CREATININE 1.34* 1.43*  CALCIUM 9.0 9.1   Cardiac Enzymes  Recent Labs  12/26/13 0045 12/26/13 0550 12/26/13 1218  TROPONINI <0.30 <  0.30 <0.30   TELE  rsr  ECG  pending  Radiology/Studies  Dg Chest 2 View  12/25/2013   CLINICAL DATA:  Shortness of breath.  EXAM: CHEST  2 VIEW  COMPARISON:  12/12/2012  FINDINGS: The heart is enlarged but stable. Stable surgical changes from bypass surgery. There are chronic bronchitic type lung changes with probable superimposed bronchitis or interstitial pneumonitis. No focal airspace consolidation or pleural effusion.  IMPRESSION: Chronic underlying lung changes with probable superimposed bronchitis.   Electronically Signed   By: Kalman Jewels M.D.   On: 12/25/2013 18:45   ASSESSMENT AND PLAN  1.  Acute on chronic systolic chf/ICM:  Remains orthopneic with bibasilar crackles and otw mild volume overload.  -5.7L yesterday (6L total for admission).  Wt down to 152 from 156 on admission.  ProBNP down to 9,070 from 12,286.  Cont bb/acei and IV lasix for today.  2.  CAD: s/p  recent nstemi. Troponins normal here though she has been having c/p this AM - now improving after sl NTG.  Recycle troponins.  May need to consider cath - previously deferred in the setting of sepsis/pna/ARF with creat up to 5.  Renal fxn currently stable.  Cont med therapy - asa, statin, bb, plavix, nitrate.  3.  DM:  Sugars stable.  Cont current regimen.  4. CKD III:  Creat up slightly but overall stable.  5.  HTN:  Stable.  6.  HL: on statin. LFT's wnl.  Signed, Rogelia Mire NP  Patient seen, examined. Available data reviewed. Agree with findings, assessment, and plan as outlined by Ignacia Bayley, NP. Pt is alert, oriented, and comfortable. Lungs with bibasilar rales. Heart regular with 2/6 systolic murmur at the LSB. Abdomen soft, NT. Chest pain noted this am, better with SL NTG. Troponins negative x 3 since admission. However, pt and family report several episodes of chest pain over the last 2 weeks. She has had NSTEMI events with systemic illnesses. Cath has not been done because of acute renal failure, but renal function now improved. Discussed potential options for evaluation, and favor cath and possible PCI. Reviewed risks, indications, alternatives to cath / PCI and pt/family understand. I advised we wait until Monday to get her off IV lasix and assure stability of renal function. Would plan on transition to PO lasix tomorrow and maybe even gentle fluids beginning Sunday in anticipation of cath. No heparin unless recurrent CP. Otherwise plans as above.  Sherren Mocha, M.D. 12/27/2013 1:07 PM

## 2013-12-27 NOTE — Progress Notes (Signed)
Nutrition Education Note  RD consulted for nutrition education.  RD provided "Low Sodium Nutrition Therapy" handout from the Academy of Nutrition and Dietetics. Reviewed patient's dietary recall. Provided examples on ways to decrease sodium intake in diet. Discouraged intake of processed foods and use of salt shaker. Encouraged fresh fruits and vegetables as well as whole grain sources of carbohydrates to maximize fiber intake.   RD discussed why it is important for patient to adhere to diet recommendations, and emphasized the role of fluids, foods to avoid, and importance of weighing self daily. Pt reports being thirsty frequently. Discussed thirst quencher tips. Teach back method used.   Expect good compliance.  Body mass index is 27 kg/(m^2). Pt meets criteria for Overweight based on current BMI.  Current diet order is Carb Modified/ 2 Gram Sodium, patient is consuming approximately 100% of meals at this time. Labs and medications reviewed. No further nutrition interventions warranted at this time. RD contact information provided. If additional nutrition issues arise, please re-consult RD.   Pryor Ochoa RD, LDN Inpatient Clinical Dietitian Pager: [phone] After Hours Pager: [phone]

## 2013-12-27 NOTE — Progress Notes (Signed)
Inpatient Diabetes Program Recommendations  AACE/ADA: New Consensus Statement on Inpatient Glycemic Control (2013)  Target Ranges:  Prepandial:   less than 140 mg/dL      Peak postprandial:   less than 180 mg/dL (1-2 hours)      Critically ill patients:  140 - 180 mg/dL   Reason for Visit: Results for EVOLETT, SOMARRIBA (MRN [id number]) as of 12/27/2013 12:34  Ref. Range 12/26/2013 11:11 12/26/2013 16:23 12/26/2013 21:22 12/27/2013 06:07 12/27/2013 11:09  Glucose-Capillary Latest Range: 70-99 mg/dL 244 (H) 174 (H) 122 (H) 77 261 (H)  Diabetes Coordinator consult:  Diabetes history: Insulin dependent Outpatient Diabetes medications: 70/30 mixed insulin 48 units every AM and 34 units every PM Current orders for Inpatient glycemic control: Novolog mix 70/30 insulin 48 units every AM and 34 units every PM, Novolog MODERATE correction scale TID   Note: Spoke with staff RN about the noon time blood sugar of 261 mg/dl.  She states that patient had insulin order HELD this AM per physician due to CBG being 77 mg/dl.  Recommend perhaps decreasing supper time 70/30 dose to 30 units and decreasing Novolog correction scale to SENSITIVE TID if CBGs continue to be less than 100 mg/dl.  If fasting CBG less than 100 mg/dl, may want to decrease AM dose to 45 units of 70/30 insulin.  Will continue to follow. Will try to talk with family about plans for going home to see if any DM education is appropriate. Harvel Ricks RN BSN CDE

## 2013-12-28 LAB — BASIC METABOLIC PANEL
BUN: 23 mg/dL (ref 6–23)
CALCIUM: 9.3 mg/dL (ref 8.4–10.5)
CO2: 25 meq/L (ref 19–32)
CREATININE: 1.62 mg/dL — AB (ref 0.50–1.10)
Chloride: 102 mEq/L (ref 96–112)
GFR calc Af Amer: 36 mL/min — ABNORMAL LOW (ref >90)
GFR calc non Af Amer: 31 mL/min — ABNORMAL LOW (ref >90)
GLUCOSE: 93 mg/dL (ref 70–99)
Potassium: 4.8 mEq/L (ref 3.7–5.3)
Sodium: 142 mEq/L (ref 137–147)

## 2013-12-28 LAB — GLUCOSE, CAPILLARY
GLUCOSE-CAPILLARY: 94 mg/dL (ref 70–99)
GLUCOSE-CAPILLARY: 167 mg/dL — AB (ref 70–99)
Glucose-Capillary: 100 mg/dL — ABNORMAL HIGH (ref 70–99)
Glucose-Capillary: 115 mg/dL — ABNORMAL HIGH (ref 70–99)

## 2013-12-28 LAB — HEPARIN LEVEL (UNFRACTIONATED): HEPARIN UNFRACTIONATED: 0.18 [IU]/mL — AB (ref 0.30–0.70)

## 2013-12-28 LAB — TROPONIN I: Troponin I: <0.3 ng/mL (ref <0.30)

## 2013-12-28 MED ORDER — HEPARIN (PORCINE) IN NACL 100-0.45 UNIT/ML-% IJ SOLN
800.0000 [IU]/h | INTRAMUSCULAR | Status: DC
  Administered 2013-12-28: 800 [IU]/h via INTRAVENOUS
  Filled 2013-12-28: qty 250

## 2013-12-28 MED ORDER — FUROSEMIDE 40 MG PO TABS
40.0000 mg | ORAL_TABLET | Freq: Two times a day (BID) | ORAL | Status: DC
  Administered 2013-12-28 – 2013-12-30 (×4): 40 mg via ORAL
  Filled 2013-12-28 (×6): qty 1

## 2013-12-28 MED ORDER — HEPARIN (PORCINE) IN NACL 100-0.45 UNIT/ML-% IJ SOLN
900.0000 [IU]/h | INTRAMUSCULAR | Status: DC
Start: 2013-12-28 — End: 2013-12-30
  Administered 2013-12-29: 900 [IU]/h via INTRAVENOUS
  Filled 2013-12-28 (×2): qty 250

## 2013-12-28 MED ORDER — TRAZODONE HCL 100 MG PO TABS
100.0000 mg | ORAL_TABLET | Freq: Every day | ORAL | Status: DC
  Administered 2013-12-28 – 2014-01-03 (×7): 100 mg via ORAL
  Filled 2013-12-28 (×8): qty 1

## 2013-12-28 NOTE — Progress Notes (Addendum)
ANTICOAGULATION CONSULT NOTE - Initial Consult  Pharmacy Consult for Heparin Indication: chest pain/ACS  Allergies  Allergen Reactions  . Nitrofuran Derivatives Hives, Shortness Of Breath and Nausea And Vomiting  . Penicillins Shortness Of Breath and Rash  . Sulfa Antibiotics Shortness Of Breath  . Ciprofloxacin Hcl Rash  . Quetiapine Fumarate Rash    Patient Measurements: Height: 5\' 3"  (160 cm) Weight: 151 lb 1.6 oz (68.539 kg) IBW/kg (Calculated) : 52.4 Heparin Dosing Weight:66.4  Vital Signs: Temp: 98 F (36.7 C) (04/11 0535) Temp src: Oral (04/11 0535) BP: 118/52 mmHg (04/11 0950) Pulse Rate: 75 (04/11 0535)  Labs:  Recent Labs  12/25/13 1541 12/26/13 0045  12/26/13 0600  12/27/13 0500 12/27/13 1154 12/27/13 1815 12/28/13 0113 12/28/13 0115  HGB 8.8* 8.3*  --   --   --   --   --   --   --   --   HCT 27.4* 26.4*  --   --   --   --   --   --   --   --   PLT 394 340  --   --   --   --   --   --   --   --   CREATININE 1.42* 1.40*  --  1.34*  --  1.43*  --   --  1.62*  --   TROPONINI  --  <0.30  < >  --   < >  --  <0.30 <0.30  --  <0.30  < > = values in this interval not displayed.  Estimated Creatinine Clearance: 30.4 ml/min (by C-G formula based on Cr of 1.62).   Medical History: Past Medical History  Diagnosis Date  . Coronary artery disease     a. s/p prior MI;  b. CABG 1999 @ [facility]; c. s/p stenting 2009 or 10.  Marland Kitchen Hypertension   . Anxiety   . Depression   . Dementia   . Diabetes mellitus with complication     a. 09/270 DKA  . GERD (gastroesophageal reflux disease)     Bilateral BKAs  . H/O hiatal hernia   . Headache(784.0)   . Arthritis   . Anemia   . Cancer     L breast cancer  . Pulmonary embolism     a. prev coumadin  . Enterococcus UTI 12/19/2012  . Cardiomyopathy, ischemic 12/12/2012    Ejection fraction 45%.  . DKA (diabetic ketoacidoses) 12/12/2012  . Encephalopathy acute 12/12/2012  . Thrombocytopenia 12/12/2012  . NSTEMI (non-ST  elevated myocardial infarction) 12/12/2012  . Peripheral vascular disease     Status post bilateral BKA    Medications:  Scheduled:  . aspirin  81 mg Oral Daily  . atorvastatin  80 mg Oral Daily  . carvedilol  6.25 mg Oral BID WC  . citalopram  40 mg Oral Daily  . clopidogrel  75 mg Oral Q breakfast  . donepezil  10 mg Oral Daily  . furosemide  40 mg Intravenous BID  . insulin aspart  0-15 Units Subcutaneous TID WC  . insulin aspart protamine- aspart  34 Units Subcutaneous Q supper  . insulin aspart protamine- aspart  48 Units Subcutaneous Q breakfast  . isosorbide mononitrate  15 mg Oral Daily  . lisinopril  2.5 mg Oral Daily  . pantoprazole  40 mg Oral Daily  . potassium chloride  40 mEq Oral BID  . sodium chloride  3 mL Intravenous Q12H  . traZODone  100 mg Oral QHS  Infusions:  . sodium chloride      Assessment: 68 yo F presents with SOB and continued CP. Pharmacy to dose heparin for possible ACS. Troponin thus far negative. Likely patient will undergo PCI on Monday. Patients hgb stable at 8, and plt wnl. No signs of bleeding noted. Patient received heparin subq this AM for prophylactic therapy.  Goal of Therapy:  Heparin level 0.3-0.7 units/ml Monitor platelets by anticoagulation protocol: Yes   Plan:  - Start Heparin 800units/hr IV with no load - Follow up on 6hr HL * 1800 - Monitor daily INR/CBC and signs of bleed  Radha M Patel 12/28/2013,12:41 PM    ===============================================   Addendum: HL = 0.18, heparin gtt was started late and level was drawn 3 hours after gtt was hung.  Unable to interpret level but suspect level to be low-low normal once at Css.    Plan: - Increase heparin gtt slightly to 900 units/hr - Check 6 hr HL    Kamiya Acord D. Mina Marble, PharmD, BCPS Pager:  [phone] 12/28/2013, 8:31 PM

## 2013-12-28 NOTE — Progress Notes (Signed)
Patient Name: Christine Hughes      SUBJECTIVE: 68 year old following a recent hospitalization in [place] "pneumonia, sepsis, renal failure (creatinine-5) with a non-STEMI and a troponin of greater than 2. An echocardiogram done in [facility] demonstrated EF 40-45% with inferior wall motion abnormalities. It was felt on admission to [facility] that she had congestive heart failure with moderate-severe tricuspid regurgitation with pulmonary hypertension. She was diuresed and because of ongoing issues with chest pain including yesterday it was elected by Dr. Lance Muss to proceed with catheterization as her renal function had continued to be improving  She had some brief chest pain this morning, less than 30 seconds.   Has a history of coronary artery disease with bypass to [facility] in 1999. She also has diabetes and peripheral vascular disease and is status post Bilateral BKAs his hypertension diabetes a history of breast cancer dementia and remote pulmonary embolism   Past Medical History  Diagnosis Date  . Coronary artery disease     a. s/p prior MI;  b. CABG 1999 @ [facility]; c. s/p stenting 2009 or 10.  Marland Kitchen Hypertension   . Anxiety   . Depression   . Dementia   . Diabetes mellitus with complication     a. 0/9381 DKA  . GERD (gastroesophageal reflux disease)     Bilateral BKAs  . H/O hiatal hernia   . Headache(784.0)   . Arthritis   . Anemia   . Cancer     L breast cancer  . Pulmonary embolism     a. prev coumadin  . Enterococcus UTI 12/19/2012  . Cardiomyopathy, ischemic 12/12/2012    Ejection fraction 45%.  . DKA (diabetic ketoacidoses) 12/12/2012  . Encephalopathy acute 12/12/2012  . Thrombocytopenia 12/12/2012  . NSTEMI (non-ST elevated myocardial infarction) 12/12/2012  . Peripheral vascular disease     Status post bilateral BKA    Scheduled Meds:  Scheduled Meds: . aspirin  81 mg Oral Daily  . atorvastatin  80 mg Oral Daily  . carvedilol  6.25 mg Oral BID WC  . citalopram   40 mg Oral Daily  . clopidogrel  75 mg Oral Q breakfast  . donepezil  10 mg Oral Daily  . furosemide  40 mg Intravenous BID  . heparin  5,000 Units Subcutaneous 3 times per day  . insulin aspart  0-15 Units Subcutaneous TID WC  . insulin aspart protamine- aspart  34 Units Subcutaneous Q supper  . insulin aspart protamine- aspart  48 Units Subcutaneous Q breakfast  . isosorbide mononitrate  15 mg Oral Daily  . lisinopril  2.5 mg Oral Daily  . pantoprazole  40 mg Oral Daily  . potassium chloride  40 mEq Oral BID  . sodium chloride  3 mL Intravenous Q12H  . traZODone  50 mg Oral QHS   Continuous Infusions: . sodium chloride      PHYSICAL EXAM Filed Vitals:   12/27/13 2209 12/28/13 0535 12/28/13 0616 12/28/13 0950  BP: 97/45 127/50  118/52  Pulse: 64 75    Temp:  98 F (36.7 C)    TempSrc:  Oral    Resp:  19    Height:      Weight:   151 lb 1.6 oz (68.539 kg)   SpO2: 99% 100%     Well developed and nourished in no acute distress HENT normal Neck supple with JVP-flat Clear Regular rate and rhythm, no murmurs or gallops Abd-soft with active BS Bilateral BKA Skin-warm and dry  A & Oriented  Grossly normal sensory and motor function  TELEMETRY: Reviewed telemetry pt in nsr    Intake/Output Summary (Last 24 hours) at 12/28/13 1126 Last data filed at 12/28/13 0946  Gross per 24 hour  Intake    966 ml  Output   1352 ml  Net   -386 ml    LABS: Basic Metabolic Panel:  Recent Labs Lab 12/25/13 1541 12/26/13 0045 12/26/13 0600 12/27/13 0500 12/28/13 0113  NA 144  --  147 145 142  K 3.3*  --  4.0 4.4 4.8  CL 101  --  104 103 102  CO2 25  --  27 28 25   GLUCOSE 60*  --  106* 73 93  BUN 26*  --  22 21 23   CREATININE 1.42* 1.40* 1.34* 1.43* 1.62*  CALCIUM 9.0  --  9.0 9.1 9.3   Cardiac Enzymes:  Recent Labs  12/27/13 1154 12/27/13 1815 12/28/13 0115  TROPONINI <0.30 <0.30 <0.30   CBC:  Recent Labs Lab 12/25/13 1541 12/26/13 0045  WBC 9.5 7.3  HGB  8.8* 8.3*  HCT 27.4* 26.4*  MCV 91.0 93.6  PLT 394 340   PROTIME: No results found for this basename: LABPROT, INR,  in the last 72 hours Liver Function Tests: No results found for this basename: AST, ALT, ALKPHOS, BILITOT, PROT, ALBUMIN,  in the last 72 hours No results found for this basename: LIPASE, AMYLASE,  in the last 72 hours BNP: BNP (last 3 results)  Recent Labs  12/25/13 1541 12/27/13 0500  PROBNP 12286.0* 9076.0*      ASSESSMENT AND PLAN:  Principal Problem:   Acute on chronic systolic congestive heart failure, NYHA class 4 Active Problems:   Anemia   Cardiomyopathy, ischemic   Depression with anxiety   DM type 1 (diabetes mellitus, type 1)   CAD (coronary artery disease)   Hx of CABG   CKD (chronic kidney disease) stage 3, GFR 30-59 ml/min   Hypertension   Hyperlipidemia  Renal functioning worsening. We'll transition her diuretics to take by mouth. Anticipate catheterization on Monday as renal function stabilizes. BNP is much improved. Increase her at bedtime trazodone for sleep Begin heparin for recurrent chest pain   Signed, Deboraha Sprang MD  12/28/2013

## 2013-12-28 NOTE — Progress Notes (Signed)
Patient heart rate down to 38, not sustained.  When heart rate down, patient went into 2nd degree heart block type 2.  Patient asymptomatic, VSS.  Dr. Radford Pax made aware.  Will continue to monitor.

## 2013-12-29 DIAGNOSIS — R197 Diarrhea, unspecified: Secondary | ICD-10-CM | POA: Diagnosis present

## 2013-12-29 LAB — BASIC METABOLIC PANEL
BUN: 27 mg/dL — AB (ref 6–23)
CO2: 24 mEq/L (ref 19–32)
Calcium: 9.8 mg/dL (ref 8.4–10.5)
Chloride: 104 mEq/L (ref 96–112)
Creatinine, Ser: 1.51 mg/dL — ABNORMAL HIGH (ref 0.50–1.10)
GFR, EST AFRICAN AMERICAN: 40 mL/min — AB (ref >90)
GFR, EST NON AFRICAN AMERICAN: 34 mL/min — AB (ref >90)
Glucose, Bld: 90 mg/dL (ref 70–99)
POTASSIUM: 5.5 meq/L — AB (ref 3.7–5.3)
SODIUM: 142 meq/L (ref 137–147)

## 2013-12-29 LAB — CBC
HEMATOCRIT: 30.6 % — AB (ref 36.0–46.0)
Hemoglobin: 9.7 g/dL — ABNORMAL LOW (ref 12.0–15.0)
MCH: 29.9 pg (ref 26.0–34.0)
MCHC: 31.7 g/dL (ref 30.0–36.0)
MCV: 94.4 fL (ref 78.0–100.0)
Platelets: 358 10*3/uL (ref 150–400)
RBC: 3.24 MIL/uL — ABNORMAL LOW (ref 3.87–5.11)
RDW: 15.5 % (ref 11.5–15.5)
WBC: 8.2 10*3/uL (ref 4.0–10.5)

## 2013-12-29 LAB — GLUCOSE, CAPILLARY
GLUCOSE-CAPILLARY: 96 mg/dL (ref 70–99)
Glucose-Capillary: 77 mg/dL (ref 70–99)
Glucose-Capillary: 115 mg/dL — ABNORMAL HIGH (ref 70–99)
Glucose-Capillary: 122 mg/dL — ABNORMAL HIGH (ref 70–99)

## 2013-12-29 LAB — HEPARIN LEVEL (UNFRACTIONATED)
Heparin Unfractionated: 1.05 IU/mL — ABNORMAL HIGH (ref 0.30–0.70)
Heparin Unfractionated: 0.52 IU/mL (ref 0.30–0.70)

## 2013-12-29 MED ORDER — SODIUM CHLORIDE 0.9 % IV SOLN: 250.0000 mL | INTRAVENOUS | Status: DC | PRN

## 2013-12-29 MED ORDER — SODIUM CHLORIDE 0.9 % IJ SOLN
3.0000 mL | Freq: Two times a day (BID) | INTRAMUSCULAR | Status: DC
  Administered 2013-12-29 – 2013-12-30 (×3): 3 mL via INTRAVENOUS

## 2013-12-29 MED ORDER — SODIUM CHLORIDE 0.9 % IJ SOLN: 3.0000 mL | INTRAMUSCULAR | Status: DC | PRN

## 2013-12-29 NOTE — Progress Notes (Signed)
Patient Name: Christine Hughes      SUBJECTIVE: 68 year old following a recent hospitalization in [place]  "pneumonia, sepsis, renal failure (creatinine-5) with a non-STEMI and a troponin of greater than 2. An echocardiogram done in [facility] demonstrated EF 40-45% with inferior wall motion abnormalities. It was felt on admission to [facility] that she had congestive heart failure with moderate-severe tricuspid regurgitation with pulmonary hypertension. She was diuresed and because of ongoing issues with chest pain including yesterday it was elected by Dr. Billee Cashing to proceed with catheterization as her renal function had continued to be improving  She had some brief chest pain yeseterday  But none today  Has a history of coronary artery disease with bypass to [facility] in 1999. She also has diabetes and peripheral vascular disease and is status post Bilateral BKAs his hypertension diabetes a history of breast cancer dementia and remote pulmonary embolism  Has hx of diarrhea and GI has been to see   Past Medical History  Diagnosis Date  . Coronary artery disease     a. s/p prior MI;  b. CABG 1999 @ [facility]; c. s/p stenting 2009 or 10.  Marland Kitchen Hypertension   . Anxiety   . Depression   . Dementia   . Diabetes mellitus with complication     a. 10/2977 DKA  . GERD (gastroesophageal reflux disease)     Bilateral BKAs  . H/O hiatal hernia   . Headache(784.0)   . Arthritis   . Anemia   . Cancer     L breast cancer  . Pulmonary embolism     a. prev coumadin  . Enterococcus UTI 12/19/2012  . Cardiomyopathy, ischemic 12/12/2012    Ejection fraction 45%.  . DKA (diabetic ketoacidoses) 12/12/2012  . Encephalopathy acute 12/12/2012  . Thrombocytopenia 12/12/2012  . NSTEMI (non-ST elevated myocardial infarction) 12/12/2012  . Peripheral vascular disease     Status post bilateral BKA    Scheduled Meds:  Scheduled Meds: . aspirin  81 mg Oral Daily  . atorvastatin  80 mg Oral Daily  . carvedilol  6.25  mg Oral BID WC  . citalopram  40 mg Oral Daily  . clopidogrel  75 mg Oral Q breakfast  . donepezil  10 mg Oral Daily  . furosemide  40 mg Oral BID  . insulin aspart  0-15 Units Subcutaneous TID WC  . insulin aspart protamine- aspart  34 Units Subcutaneous Q supper  . insulin aspart protamine- aspart  48 Units Subcutaneous Q breakfast  . isosorbide mononitrate  15 mg Oral Daily  . lisinopril  2.5 mg Oral Daily  . pantoprazole  40 mg Oral Daily  . potassium chloride  40 mEq Oral BID  . sodium chloride  3 mL Intravenous Q12H  . sodium chloride  3 mL Intravenous Q12H  . traZODone  100 mg Oral QHS   Continuous Infusions: . sodium chloride    . heparin 900 Units/hr (12/28/13 2037)    PHYSICAL EXAM Filed Vitals:   12/28/13 1430 12/28/13 2038 12/29/13 0647 12/29/13 1055  BP: 107/43 114/45 108/40 109/41  Pulse: 75 72 84 76  Temp: 97.8 F (36.6 C) 98.5 F (36.9 C) 98.8 F (37.1 C)   TempSrc: Oral Oral Oral   Resp: 20 18 18    Height:      Weight:   148 lb 1.6 oz (67.178 kg)   SpO2: 100% 100% 99%    Well developed and nourished in no acute distress HENT normal Neck  supple with JVP-flat Clear Regular rate and rhythm, no murmurs or gallops Abd-soft with active BS Bilateral BKA Skin-warm and dry A & Oriented  Grossly normal sensory and motor function  TELEMETRY: Reviewed telemetry pt in nsr    Intake/Output Summary (Last 24 hours) at 12/29/13 1312 Last data filed at 12/29/13 0842  Gross per 24 hour  Intake 602.76 ml  Output   1525 ml  Net -922.24 ml    LABS: Basic Metabolic Panel:  Recent Labs Lab 12/25/13 1541 12/26/13 0045 12/26/13 0600 12/27/13 0500 12/28/13 0113 12/29/13 0515  NA 144  --  147 145 142 142  K 3.3*  --  4.0 4.4 4.8 5.5*  CL 101  --  104 103 102 104  CO2 25  --  27 28 25 24   GLUCOSE 60*  --  106* 73 93 90  BUN 26*  --  22 21 23  27*  CREATININE 1.42* 1.40* 1.34* 1.43* 1.62* 1.51*  CALCIUM 9.0  --  9.0 9.1 9.3 9.8   Cardiac  Enzymes:  Recent Labs  12/27/13 1154 12/27/13 1815 12/28/13 0115  TROPONINI <0.30 <0.30 <0.30   CBC:  Recent Labs Lab 12/25/13 1541 12/26/13 0045 12/29/13 0515  WBC 9.5 7.3 8.2  HGB 8.8* 8.3* 9.7*  HCT 27.4* 26.4* 30.6*  MCV 91.0 93.6 94.4  PLT 394 340 358   PROTIME: No results found for this basename: LABPROT, INR,  in the last 72 hours Liver Function Tests: No results found for this basename: AST, ALT, ALKPHOS, BILITOT, PROT, ALBUMIN,  in the last 72 hours No results found for this basename: LIPASE, AMYLASE,  in the last 72 hours BNP: BNP (last 3 results)  Recent Labs  12/25/13 1541 12/27/13 0500  PROBNP 12286.0* 9076.0*      ASSESSMENT AND PLAN:  Principal Problem:   Acute on chronic systolic congestive heart failure, NYHA class 4 Active Problems:   Anemia   Cardiomyopathy, ischemic   Depression with anxiety   DM type 1 (diabetes mellitus, type 1)   CAD (coronary artery disease)   Hx of CABG   CKD (chronic kidney disease) stage 3, GFR 30-59 ml/min   Hypertension   Hyperlipidemia   Diarrhea  Renal functioning worsening. We'll transition her diuretics to take by mouth. Anticipate catheterization on Monday as renal function stabilizes. BNP is much improved. Started heparin yesterday for recurrent chest pain   Signed, Deboraha Sprang MD  12/29/2013

## 2013-12-29 NOTE — Progress Notes (Signed)
Heparin level 1.05 this am though discovered that phlebotomy drew labs from above IV site, making this level inaccurate.  Will obtain another level prior to changing heparin rate.  Wynona Neat, PharmD, BCPS 12/29/2013 6:03 AM

## 2013-12-29 NOTE — Consult Note (Signed)
Gastroenterology Consult: for Dr Carol Ada.  9:43 AM 12/29/2013  LOS: 4 days    Referring Provider:  Dr Caryl Comes  Primary Care Physician:  Nicholes Stairs, MD Primary Gastroenterologist:  Althia Forts.  Dr Toma Deiters in [place].   Reason for Consultation:  Diarrhea   HPI: Christine Hughes is a 68 y.o. female.  Hc CAD with CABG in 1999.  ASPVD, type 1 DM,  S/p BKA, dementia, remote PE, hx breast cancer (left lumpectomy). Frequent UTIs.   Recent hospitalization in [place] with "pneumonia, sepsis, renal failure (creatinine-5) with a non-STEMI and a troponin of greater than 2. Echocardiogram demonstrates EF 40-45% with inferior wall motion abnormalities.  Dr Caryl Comes feels pt has congestive heart failure with moderate-severe tricuspid regurgitation with pulmonary hypertension  Following [facility] discharge, pt's family drove her to [facility] 12/12/13 seeking further care.  She is anticoagulated with Heparin.  Plan is for cardiac cath tomorrow.   Hx of chronic diarrhea for several months. " Mega workup per dtr - non diagnostic".  This appears to have consisted of colonoscopy, pt thinks EGD as well.  Unable to recall if she had CT etc.  Was advised by Dr Earley Brooke in [place] to avoid dairy.  Has 2 to 3 loose/watery, non-bloody stools daily.  Has not improved with Questran, Imodium.  No abdominal pain, no nausea.  No fecal incontinence. Does not recall new meds at time of diarrhea onset. She takes Aricept and Celexa, which can cause diarrhea. Not clear when these were was started.  Stopped 4 x daily Motrin about 2 weeks ago, after recent hospital admission with ARF.  Took  This for chronic right hip pain.  Has consult scheduled with pain mgt clinic in [place] in coming weeks.  No narcotics at home.         Past Medical History    Diagnosis Date  . Coronary artery disease     a. s/p prior MI;  b. CABG 1999 @ [facility]; c. s/p stenting 2009 or 10.  Marland Kitchen Hypertension   . Anxiety   . Depression   . Dementia   . Diabetes mellitus with complication     a. 0000000 DKA  . GERD (gastroesophageal reflux disease)     Bilateral BKAs  . H/O hiatal hernia   . Headache(784.0)   . Arthritis   . Anemia   . Cancer     L breast cancer  . Pulmonary embolism     a. prev coumadin  . Enterococcus UTI 12/19/2012  . Cardiomyopathy, ischemic 12/12/2012    Ejection fraction 45%.  . DKA (diabetic ketoacidoses) 12/12/2012  . Encephalopathy acute 12/12/2012  . Thrombocytopenia 12/12/2012  . NSTEMI (non-ST elevated myocardial infarction) 12/12/2012  . Peripheral vascular disease     Status post bilateral BKA    Past Surgical History  Procedure Laterality Date  . Coronary angioplasty    . Back surgery    . Coronary artery bypass graft    . Appendectomy    . Abdominal hysterectomy    . Mastectomy    . Joint replacement  Hip  . Breast surgery      left  . Fracture surgery      R femur  . Cholecystectomy    . Tonsillectomy    . Leg amputation below knee Bilateral     Prior to Admission medications   Medication Sig Start Date End Date Taking? Authorizing Provider  ALPRAZolam Duanne Moron) 0.5 MG tablet Take 0.5 mg by mouth 3 (three) times daily as needed for anxiety.    Yes Historical Provider, MD  aspirin 81 MG chewable tablet Chew 1 tablet (81 mg total) by mouth daily. 12/21/12  Yes Rexene Alberts, MD  atorvastatin (LIPITOR) 80 MG tablet Take 80 mg by mouth daily.   Yes Historical Provider, MD  carvedilol (COREG) 6.25 MG tablet Take 6.25 mg by mouth 2 (two) times daily with a meal.   Yes Historical Provider, MD  citalopram (CELEXA) 40 MG tablet Take 40 mg by mouth daily.   Yes Historical Provider, MD  clopidogrel (PLAVIX) 75 MG tablet Take 75 mg by mouth daily.   Yes Historical Provider, MD  donepezil (ARICEPT) 10 MG tablet Take 10 mg by  mouth daily.    Yes Historical Provider, MD  esomeprazole (NEXIUM) 40 MG capsule Take 40 mg by mouth daily before breakfast.   Yes Historical Provider, MD  furosemide (LASIX) 20 MG tablet Take 1 tablet (20 mg total) by mouth daily. 12/21/12  Yes Rexene Alberts, MD  insulin NPH-regular (NOVOLIN 70/30) (70-30) 100 UNIT/ML injection Inject 34-48 Units into the skin. 48 units in AM, 34 units in PM   Yes Historical Provider, MD  isosorbide mononitrate (IMDUR) 30 MG 24 hr tablet Take 0.5 tablets (15 mg total) by mouth daily. 12/21/12  Yes Rexene Alberts, MD  lisinopril (PRINIVIL,ZESTRIL) 2.5 MG tablet Take 2.5 mg by mouth daily.   Yes Historical Provider, MD  nitroGLYCERIN (NITROSTAT) 0.4 MG SL tablet Place 0.4 mg under the tongue every 5 (five) minutes as needed for chest pain.   Yes Historical Provider, MD  traZODone (DESYREL) 100 MG tablet Take 0.5 tablets (50 mg total) by mouth at bedtime. 12/21/12  Yes Rexene Alberts, MD    Scheduled Meds: . aspirin  81 mg Oral Daily  . atorvastatin  80 mg Oral Daily  . carvedilol  6.25 mg Oral BID WC  . citalopram  40 mg Oral Daily  . clopidogrel  75 mg Oral Q breakfast  . donepezil  10 mg Oral Daily  . furosemide  40 mg Oral BID  . insulin aspart  0-15 Units Subcutaneous TID WC  . insulin aspart protamine- aspart  34 Units Subcutaneous Q supper  . insulin aspart protamine- aspart  48 Units Subcutaneous Q breakfast  . isosorbide mononitrate  15 mg Oral Daily  . lisinopril  2.5 mg Oral Daily  . pantoprazole  40 mg Oral Daily  . potassium chloride  40 mEq Oral BID  . sodium chloride  3 mL Intravenous Q12H  . sodium chloride  3 mL Intravenous Q12H  . traZODone  100 mg Oral QHS   Infusions: . sodium chloride    . heparin 900 Units/hr (12/28/13 2037)   PRN Meds: sodium chloride, sodium chloride, acetaminophen, ALPRAZolam, nitroGLYCERIN, sodium chloride, sodium chloride   Allergies as of 12/25/2013 - Review Complete 12/25/2013  Allergen Reaction Noted  .  Nitrofuran derivatives Hives, Shortness Of Breath, and Nausea And Vomiting 12/12/2012  . Penicillins Shortness Of Breath and Rash 12/12/2012  . Sulfa antibiotics Shortness Of Breath 12/12/2012  . Ciprofloxacin hcl  Rash 12/12/2012  . Quetiapine fumarate Rash 12/12/2012    History reviewed. No pertinent family history.  History   Social History  . Marital Status: Unknown    Spouse Name: N/A    Number of Children: N/A  . Years of Education: N/A   Occupational History  . Not on file.   Social History Main Topics  . Smoking status: Former Research scientist (life sciences)  . Smokeless tobacco: Never Used     Comment: prev tob, quit 1999  . Alcohol Use: No  . Drug Use: No  . Sexual Activity: Not Currently   Other Topics Concern  . Not on file   Social History Narrative   Lives at [place]: Constitutional:  Weight down from 3 weeks ago due to diuresis Social:  Critical and unhappy with most of her local MDs ENT:  No nose bleeds Pulm:  No dob or cough CV:  No palpitations, no LE edema.  GU:  No hematuria, no frequency GI:  Per HPI Heme:  No iron therapy in recent years   Transfusions:  Remotely, > 20 yrs. Neuro:  No headaches, no peripheral tingling or numbness Derm:  No itching, no rash or sores.  Endocrine:  No sweats or chills.  No polyuria or dysuria Immunization:  Not queried.  Travel:  None beyond local counties in last few months.    PHYSICAL EXAM: Vital signs in last 24 hours: Filed Vitals:   12/29/13 0647  BP: 108/40  Pulse: 84  Temp: 98.8 F (37.1 C)  Resp: 18   Wt Readings from Last 3 Encounters:  12/29/13 67.178 kg (148 lb 1.6 oz)  12/21/12 80.2 kg (176 lb 12.9 oz)    General: unhappy, moderately chronically ill looking WF.  comfortable Head:  No asymmetry or trauma  Eyes:  No icterus or pallor Ears:  Not HOH  Nose:  No discharge Mouth:  Full dentures, clear and moist MM Neck:  No mass or JVD.   Lungs:  Clear bil  Heart: RRR, no  MRG Abdomen:  Soft, NT, ND.  No mass, bruits or HSM.   Rectal: light brown, soft/liquid stool is FOBT negative.  No mass.  Scar just superior to rectum (? Pilonidal  Cyst excision)   Musc/Skeltl: bil BKA with well healed, healthy looking stumps Extremities:  bil BKA Neurologic:  Oriented x 3.  inconsistent recall of medical events .  Moves all 4 limbs.  Skin:  No rash, no sores. Tattoos:  On left arm Nodes:  No cervical adenopathy.    Psych:  Cooperative, flat affect.   Intake/Output from previous day: 04/11 0701 - 04/12 0700 In: 605.8 [P.O.:480; I.V.:125.8] Out: 1527 [Urine:1526; Stool:1] Intake/Output this shift: Total I/O In: 240 [P.O.:240] Out: -   LAB RESULTS:  Recent Labs  12/29/13 0515  WBC 8.2  HGB 9.7*  HCT 30.6*  PLT 358   BMET Lab Results  Component Value Date   NA 142 12/29/2013   NA 142 12/28/2013   NA 145 12/27/2013   K 5.5* 12/29/2013   K 4.8 12/28/2013   K 4.4 12/27/2013   CL 104 12/29/2013   CL 102 12/28/2013   CL 103 12/27/2013   CO2 24 12/29/2013   CO2 25 12/28/2013   CO2 28 12/27/2013   GLUCOSE 90 12/29/2013   GLUCOSE 93 12/28/2013   GLUCOSE 73 12/27/2013   BUN 27* 12/29/2013   BUN 23 12/28/2013   BUN 21 12/27/2013   CREATININE 1.51*  12/29/2013   CREATININE 1.62* 12/28/2013   CREATININE 1.43* 12/27/2013   CALCIUM 9.8 12/29/2013   CALCIUM 9.3 12/28/2013   CALCIUM 9.1 12/27/2013   LFT No results found for this basename: PROT, ALBUMIN, AST, ALT, ALKPHOS, BILITOT, BILIDIR, IBILI,  in the last 72 hours PT/INR No results found for this basename: INR, PROTIME   Hepatitis Panel No results found for this basename: HEPBSAG, HCVAB, HEPAIGM, HEPBIGM,  in the last 72 hours C-Diff No components found with this basename: cdiff   Lipase  No results found for this basename: lipase    Drugs of Abuse  No results found for this basename: labopia, cocainscrnur, labbenz, amphetmu, thcu, labbarb     RADIOLOGY STUDIES: No results found.  ENDOSCOPIC STUDIES: Per  HPI  IMPRESSION:   *  Chronic, non-large volume diarrhea.  Colonoscopy (EGD?) within last 8 months was apparently unremarkable. Has not responded to Questran, immodium.  Need to obtain the records  *  CAD.  ACS.  CHF, class 4 sxs. .  Cath set for tomorrow  *  Longstanding 40 yr hx IDDM  *  ASPVD.  S/p bil AKA ~ 2006  *  Hx breast cancer.  Left lumpectomy and radiation completed.   *  Right hip pain, s/p hip surgery.    PLAN:     *  Need to obtain GI records from Dr Earley Brooke tomorrow. *  Dr Benson Norway will assume care in AM. We are covering his unassigned call this weekend.  *  Cardiac cath planned tomorrow.     Vena Rua  12/29/2013, 9:43 AM Pager: [phone].  GI Attending Note   Chart was reviewed and patient was examined. X-rays and lab were reviewed.   Chronic diarrhea is probably multifactorial.  Possible etiologies include medications, bacterial overgrowth related to her diabetes.  Patient has apparently undergone previous workup for this problem.  Would not pursue GI workup any further until prior records have been reviewed.  Sandy Salaam. Deatra Ina, M.D., [facility] Gastroenterology Cell [phone]

## 2013-12-29 NOTE — Progress Notes (Signed)
ANTICOAGULATION CONSULT NOTE - Follow-up Consult  Pharmacy Consult for Heparin Indication: chest pain/ACS  Allergies  Allergen Reactions  . Nitrofuran Derivatives Hives, Shortness Of Breath and Nausea And Vomiting  . Penicillins Shortness Of Breath and Rash  . Sulfa Antibiotics Shortness Of Breath  . Ciprofloxacin Hcl Rash  . Quetiapine Fumarate Rash    Patient Measurements: Height: 5\' 3"  (160 cm) Weight: 148 lb 1.6 oz (67.178 kg) (Bedscale) IBW/kg (Calculated) : 52.4 Heparin Dosing Weight:66.4  Vital Signs: Temp: 98.8 F (37.1 C) (04/12 0647) Temp src: Oral (04/12 0647) BP: 108/40 mmHg (04/12 0647) Pulse Rate: 84 (04/12 0647)  Labs:  Recent Labs  12/27/13 0500 12/27/13 1154 12/27/13 1815 12/28/13 0113 12/28/13 0115 12/28/13 1953 12/29/13 0515 12/29/13 0715  HGB  --   --   --   --   --   --  9.7*  --   HCT  --   --   --   --   --   --  30.6*  --   PLT  --   --   --   --   --   --  358  --   HEPARINUNFRC  --   --   --   --   --  0.18* 1.05* 0.52  CREATININE 1.43*  --   --  1.62*  --   --  1.51*  --   TROPONINI  --  <0.30 <0.30  --  <0.30  --   --   --     Estimated Creatinine Clearance: 32.4 ml/min (by C-G formula based on Cr of 1.51).   Medical History: Past Medical History  Diagnosis Date  . Coronary artery disease     a. s/p prior MI;  b. CABG 1999 @ [facility]; c. s/p stenting 2009 or 10.  Marland Kitchen Hypertension   . Anxiety   . Depression   . Dementia   . Diabetes mellitus with complication     a. 11/3823 DKA  . GERD (gastroesophageal reflux disease)     Bilateral BKAs  . H/O hiatal hernia   . Headache(784.0)   . Arthritis   . Anemia   . Cancer     L breast cancer  . Pulmonary embolism     a. prev coumadin  . Enterococcus UTI 12/19/2012  . Cardiomyopathy, ischemic 12/12/2012    Ejection fraction 45%.  . DKA (diabetic ketoacidoses) 12/12/2012  . Encephalopathy acute 12/12/2012  . Thrombocytopenia 12/12/2012  . NSTEMI (non-ST elevated myocardial infarction)  12/12/2012  . Peripheral vascular disease     Status post bilateral BKA    Medications:  Scheduled:  . aspirin  81 mg Oral Daily  . atorvastatin  80 mg Oral Daily  . carvedilol  6.25 mg Oral BID WC  . citalopram  40 mg Oral Daily  . clopidogrel  75 mg Oral Q breakfast  . donepezil  10 mg Oral Daily  . furosemide  40 mg Oral BID  . insulin aspart  0-15 Units Subcutaneous TID WC  . insulin aspart protamine- aspart  34 Units Subcutaneous Q supper  . insulin aspart protamine- aspart  48 Units Subcutaneous Q breakfast  . isosorbide mononitrate  15 mg Oral Daily  . lisinopril  2.5 mg Oral Daily  . pantoprazole  40 mg Oral Daily  . potassium chloride  40 mEq Oral BID  . sodium chloride  3 mL Intravenous Q12H  . sodium chloride  3 mL Intravenous Q12H  . traZODone  100 mg  Oral QHS   Infusions:  . sodium chloride    . heparin 900 Units/hr (12/28/13 2037)    Assessment: 68 yo F presents with SOB and continued CP. Pharmacy to dose heparin for possible ACS. Troponin thus far negative. Likely patient will undergo PCI on Monday. Patients hgb stable at 9, and plt wnl. No signs of bleeding noted. After HL redrawn, level returned therapeutic.   Goal of Therapy:  Heparin level 0.3-0.7 units/ml Monitor platelets by anticoagulation protocol: Yes   Plan:  - Start Heparin 900units/hr IV  - Monitor daily INR/CBC and signs of bleed  Ashleyanne Hemmingway M Seren Chaloux 12/29/2013,8:57 AM

## 2013-12-30 DIAGNOSIS — E875 Hyperkalemia: Secondary | ICD-10-CM | POA: Diagnosis present

## 2013-12-30 DIAGNOSIS — N179 Acute kidney failure, unspecified: Secondary | ICD-10-CM

## 2013-12-30 DIAGNOSIS — I2510 Atherosclerotic heart disease of native coronary artery without angina pectoris: Secondary | ICD-10-CM

## 2013-12-30 DIAGNOSIS — R0789 Other chest pain: Secondary | ICD-10-CM | POA: Insufficient documentation

## 2013-12-30 LAB — CBC
HEMATOCRIT: 32.4 % — AB (ref 36.0–46.0)
Hemoglobin: 10.2 g/dL — ABNORMAL LOW (ref 12.0–15.0)
MCH: 29.6 pg (ref 26.0–34.0)
MCHC: 31.5 g/dL (ref 30.0–36.0)
MCV: 93.9 fL (ref 78.0–100.0)
Platelets: 384 10*3/uL (ref 150–400)
RBC: 3.45 MIL/uL — ABNORMAL LOW (ref 3.87–5.11)
RDW: 15.6 % — ABNORMAL HIGH (ref 11.5–15.5)
WBC: 9.2 10*3/uL (ref 4.0–10.5)

## 2013-12-30 LAB — GLUCOSE, CAPILLARY
Glucose-Capillary: 111 mg/dL — ABNORMAL HIGH (ref 70–99)
Glucose-Capillary: 113 mg/dL — ABNORMAL HIGH (ref 70–99)
Glucose-Capillary: 182 mg/dL — ABNORMAL HIGH (ref 70–99)
Glucose-Capillary: 120 mg/dL — ABNORMAL HIGH (ref 70–99)

## 2013-12-30 LAB — BASIC METABOLIC PANEL
BUN: 32 mg/dL — ABNORMAL HIGH (ref 6–23)
CO2: 21 meq/L (ref 19–32)
Calcium: 9.7 mg/dL (ref 8.4–10.5)
Chloride: 102 mEq/L (ref 96–112)
Creatinine, Ser: 1.58 mg/dL — ABNORMAL HIGH (ref 0.50–1.10)
GFR calc Af Amer: 37 mL/min — ABNORMAL LOW (ref >90)
GFR, EST NON AFRICAN AMERICAN: 32 mL/min — AB (ref >90)
GLUCOSE: 100 mg/dL — AB (ref 70–99)
POTASSIUM: 5.6 meq/L — AB (ref 3.7–5.3)
SODIUM: 140 meq/L (ref 137–147)

## 2013-12-30 LAB — PROTIME-INR
INR: 1.12 (ref 0.00–1.49)
Prothrombin Time: 14.2 seconds (ref 11.6–15.2)

## 2013-12-30 LAB — HEPARIN LEVEL (UNFRACTIONATED): Heparin Unfractionated: 0.39 IU/mL (ref 0.30–0.70)

## 2013-12-30 MED ORDER — SODIUM CHLORIDE 0.9 % IV SOLN
INTRAVENOUS | Status: DC
  Administered 2013-12-31: 04:00:00 via INTRAVENOUS

## 2013-12-30 NOTE — Progress Notes (Signed)
ANTICOAGULATION CONSULT NOTE - Follow-up Consult  Pharmacy Consult for Heparin Indication: chest pain/ACS  Allergies  Allergen Reactions  . Nitrofuran Derivatives Hives, Shortness Of Breath and Nausea And Vomiting  . Penicillins Shortness Of Breath and Rash  . Sulfa Antibiotics Shortness Of Breath  . Ciprofloxacin Hcl Rash  . Quetiapine Fumarate Rash    Patient Measurements: Height: 5\' 3"  (160 cm) Weight: 146 lb 6.4 oz (66.407 kg) (bed scal) IBW/kg (Calculated) : 52.4 Heparin Dosing Weight:66.4  Vital Signs: Temp: 99.6 F (37.6 C) (04/13 0706) Temp src: Oral (04/13 0706) BP: 125/48 mmHg (04/13 0706) Pulse Rate: 86 (04/13 0706)  Labs:  Recent Labs  12/27/13 1154 12/27/13 1815 12/28/13 0113 12/28/13 0115  12/29/13 0515 12/29/13 0715 12/30/13 0416 12/30/13 0615  HGB  --   --   --   --   --  9.7*  --   --  10.2*  HCT  --   --   --   --   --  30.6*  --   --  32.4*  PLT  --   --   --   --   --  358  --   --  384  LABPROT  --   --   --   --   --   --   --   --  14.2  INR  --   --   --   --   --   --   --   --  1.12  HEPARINUNFRC  --   --   --   --   < > 1.05* 0.52  --  0.39  CREATININE  --   --  1.62*  --   --  1.51*  --  1.58*  --   TROPONINI <0.30 <0.30  --  <0.30  --   --   --   --   --   < > = values in this interval not displayed.  Estimated Creatinine Clearance: 30.8 ml/min (by C-G formula based on Cr of 1.58).   Medical History: Past Medical History  Diagnosis Date  . Coronary artery disease     a. s/p prior MI;  b. CABG 1999 @ [facility]; c. s/p stenting 2009 or 10.  Marland Kitchen Hypertension   . Anxiety   . Depression   . Dementia   . Diabetes mellitus with complication     a. 03/8294 DKA  . GERD (gastroesophageal reflux disease)     Bilateral BKAs  . H/O hiatal hernia   . Headache(784.0)   . Arthritis   . Anemia   . Cancer     L breast cancer  . Pulmonary embolism     a. prev coumadin  . Enterococcus UTI 12/19/2012  . Cardiomyopathy, ischemic 12/12/2012   Ejection fraction 45%.  . DKA (diabetic ketoacidoses) 12/12/2012  . Encephalopathy acute 12/12/2012  . Thrombocytopenia 12/12/2012  . NSTEMI (non-ST elevated myocardial infarction) 12/12/2012  . Peripheral vascular disease     Status post bilateral BKA    Medications:  Scheduled:  . aspirin  81 mg Oral Daily  . atorvastatin  80 mg Oral Daily  . carvedilol  6.25 mg Oral BID WC  . citalopram  40 mg Oral Daily  . clopidogrel  75 mg Oral Q breakfast  . donepezil  10 mg Oral Daily  . furosemide  40 mg Oral BID  . insulin aspart  0-15 Units Subcutaneous TID WC  . insulin aspart protamine- aspart  34  Units Subcutaneous Q supper  . insulin aspart protamine- aspart  48 Units Subcutaneous Q breakfast  . isosorbide mononitrate  15 mg Oral Daily  . lisinopril  2.5 mg Oral Daily  . pantoprazole  40 mg Oral Daily  . sodium chloride  3 mL Intravenous Q12H  . sodium chloride  3 mL Intravenous Q12H  . traZODone  100 mg Oral QHS   Infusions:  . sodium chloride 75 mL/hr at 12/30/13 0423  . heparin 900 Units/hr (12/29/13 1631)    Assessment: 68 yo F presents with SOB and continued CP. Pharmacy to dose heparin for possible ACS. Troponin thus far negative. Plan was for pt to go to cath lab today but awaiting improvement in renal fx - possible tomorrow.  Patients hgb stable at 9, and plt wnl. No signs of bleeding noted.  Heparin drip 900 uts/hr HL 0.39 at goal.  Goal of Therapy:  Heparin level 0.3-0.7 units/ml Monitor platelets by anticoagulation protocol: Yes   Plan:  - Start Heparin 900units/hr IV  - Monitor daily INR/CBC and signs of bleed  Bonnita Nasuti Pharm.D. CPP, BCPS Clinical Pharmacist [phone] 12/30/2013 10:40 AM

## 2013-12-30 NOTE — Progress Notes (Signed)
Patient Name: Christine Hughes Date of Encounter: 12/30/2013   Principal Problem:   Acute on chronic systolic congestive heart failure, NYHA class 4 Active Problems:   Cardiomyopathy, ischemic   CAD (coronary artery disease)   Hx of CABG   Midsternal chest pain   DM type 1 (diabetes mellitus, type 1)   CKD (chronic kidney disease) stage 3, GFR 30-59 ml/min   Anemia   Depression with anxiety   Hypertension   Hyperlipidemia   Diarrhea   Hyperkalemia    SUBJECTIVE  Mild recurrent chest pain, no SOB.   CURRENT MEDS  . aspirin  81 mg Oral Daily  . atorvastatin  80 mg Oral Daily  . carvedilol  6.25 mg Oral BID WC  . citalopram  40 mg Oral Daily  . clopidogrel  75 mg Oral Q breakfast  . donepezil  10 mg Oral Daily  . furosemide  40 mg Oral BID  . insulin aspart  0-15 Units Subcutaneous TID WC  . insulin aspart protamine- aspart  34 Units Subcutaneous Q supper  . insulin aspart protamine- aspart  48 Units Subcutaneous Q breakfast  . isosorbide mononitrate  15 mg Oral Daily  . pantoprazole  40 mg Oral Daily  . sodium chloride  3 mL Intravenous Q12H  . sodium chloride  3 mL Intravenous Q12H  . traZODone  100 mg Oral QHS   OBJECTIVE  Filed Vitals:   12/29/13 2029 12/29/13 2039 12/30/13 0706 12/30/13 1053  BP: 103/35 110/54 125/48 112/37  Pulse: 71  86 70  Temp: 98.8 F (37.1 C)  99.6 F (37.6 C)   TempSrc: Tympanic  Oral   Resp: 18  18   Height:      Weight:   146 lb 6.4 oz (66.407 kg)   SpO2: 97%  96% 100%    Intake/Output Summary (Last 24 hours) at 12/30/13 1149 Last data filed at 12/30/13 0847  Gross per 24 hour  Intake    627 ml  Output   2251 ml  Net  -1624 ml   Filed Weights   12/29/13 0647 12/29/13 0734 12/30/13 0706  Weight: 148 lb 1.6 oz (67.178 kg) 148 lb 1.6 oz (67.178 kg) 146 lb 6.4 oz (66.407 kg)    PHYSICAL EXAM  General: Pleasant, NAD. Neuro: Alert and oriented X 3. Moves all extremities spontaneously. Psych: Normal affect. HEENT:   Normal  Neck: Supple without bruits or JVD. Lungs:  Resp regular and unlabored, CTA. Heart: RRR no s3, s4, or murmurs. Abdomen: Soft, non-tender, non-distended, BS + x 4.  Extremities: No clubbing, cyanosis or edema. DP/PT/Radials 2+ and equal bilaterally.  Accessory Clinical Findings  CBC  Recent Labs  12/29/13 0515 12/30/13 0615  WBC 8.2 9.2  HGB 9.7* 10.2*  HCT 30.6* 32.4*  MCV 94.4 93.9  PLT 358 350   Basic Metabolic Panel  Recent Labs  12/29/13 0515 12/30/13 0416  NA 142 140  K 5.5* 5.6*  CL 104 102  CO2 24 21  GLUCOSE 90 100*  BUN 27* 32*  CREATININE 1.51* 1.58*  CALCIUM 9.8 9.7   Cardiac Enzymes  Recent Labs  12/27/13 1154 12/27/13 1815 12/28/13 0115  TROPONINI <0.30 <0.30 <0.30   TELE: SR  Radiology/Studies  Dg Chest 2 View  12/25/2013   CLINICAL DATA:  Shortness of breath.  EXAM: CHEST  2 VIEW  COMPARISON:  12/12/2012  FINDINGS: The heart is enlarged but stable. Stable surgical changes from bypass surgery. There are chronic bronchitic type lung  changes with probable superimposed bronchitis or interstitial pneumonitis. No focal airspace consolidation or pleural effusion.  IMPRESSION: Chronic underlying lung changes with probable superimposed bronchitis.   Electronically Signed   By: Kalman Jewels M.D.   On: 12/25/2013 18:45   ASSESSMENT AND PLAN  1. Acute on chronic systolic chf/ICM:  EF 77-82% by echo report from earlier this year in [place].  - 934 overnight and -9 L for this admission.  Wt down 10 lbs since admission.  2.  CAD:  S/p recent nstemi with recurrent chest pain here.  Cath previously deferred 2/2 acute renal failure in the setting of sepsis/pna, with creat rising to 5.  Plan for cath today however creat slightly higher than yesterday @ 1.58.  Cath deferred.  Will hold lisinopril.  3.  DM:  Sugars stable.  Cont current regimen.  4.  CKD III: creat up slightly.  Hold ace inhibitor.  5.  HTN:  Stable.  6.  HL: on statin.  LFT's  wnl.  7. Diarrhea:  GI seeing.  Appreciate input.  Signed, Rogelia Mire NP  The patient was seen, examined and discussed with Carmela Rima, NP and agree as above.  Awaiting cath for recurrent chest pain. On iv Heparin, on and off chest pain, would stop Heparin. Slightly worsened Crea today. We will hold diuretics and ACEI prior to the cath. We will start NS at 50 cc/hr in the am prior to the cath.   [name] 12/30/2013

## 2013-12-31 ENCOUNTER — Encounter (HOSPITAL_COMMUNITY)
Admission: EM | Disposition: A | Discharge status: Home / Self Care | Payer: Self-pay | Source: Home / Self Care | Attending: Internal Medicine

## 2013-12-31 DIAGNOSIS — I2510 Atherosclerotic heart disease of native coronary artery without angina pectoris: Secondary | ICD-10-CM

## 2013-12-31 HISTORY — PX: LEFT HEART CATHETERIZATION WITH CORONARY/GRAFT ANGIOGRAM: SHX5450

## 2013-12-31 LAB — BASIC METABOLIC PANEL
BUN: 36 mg/dL — ABNORMAL HIGH (ref 6–23)
CO2: 22 mEq/L (ref 19–32)
Calcium: 9.4 mg/dL (ref 8.4–10.5)
Chloride: 103 mEq/L (ref 96–112)
Creatinine, Ser: 1.61 mg/dL — ABNORMAL HIGH (ref 0.50–1.10)
GFR calc Af Amer: 37 mL/min — ABNORMAL LOW (ref >90)
GFR calc non Af Amer: 32 mL/min — ABNORMAL LOW (ref >90)
GLUCOSE: 118 mg/dL — AB (ref 70–99)
POTASSIUM: 5 meq/L (ref 3.7–5.3)
SODIUM: 140 meq/L (ref 137–147)

## 2013-12-31 LAB — CBC
HEMATOCRIT: 30.9 % — AB (ref 36.0–46.0)
Hemoglobin: 9.6 g/dL — ABNORMAL LOW (ref 12.0–15.0)
MCH: 29.3 pg (ref 26.0–34.0)
MCHC: 31.1 g/dL (ref 30.0–36.0)
MCV: 94.2 fL (ref 78.0–100.0)
Platelets: 321 10*3/uL (ref 150–400)
RBC: 3.28 MIL/uL — AB (ref 3.87–5.11)
RDW: 15.6 % — ABNORMAL HIGH (ref 11.5–15.5)
WBC: 8.8 10*3/uL (ref 4.0–10.5)

## 2013-12-31 LAB — GLUCOSE, CAPILLARY
GLUCOSE-CAPILLARY: 125 mg/dL — AB (ref 70–99)
GLUCOSE-CAPILLARY: 197 mg/dL — AB (ref 70–99)
Glucose-Capillary: 125 mg/dL — ABNORMAL HIGH (ref 70–99)
Glucose-Capillary: 118 mg/dL — ABNORMAL HIGH (ref 70–99)
Glucose-Capillary: 160 mg/dL — ABNORMAL HIGH (ref 70–99)

## 2013-12-31 LAB — POCT ACTIVATED CLOTTING TIME: Activated Clotting Time: 105 seconds

## 2013-12-31 SURGERY — LEFT HEART CATHETERIZATION WITH CORONARY/GRAFT ANGIOGRAM
Anesthesia: LOCAL

## 2013-12-31 MED ORDER — ASPIRIN 81 MG PO CHEW: 81.0000 mg | CHEWABLE_TABLET | Freq: Every day | ORAL | Status: DC

## 2013-12-31 MED ORDER — FENTANYL CITRATE 0.05 MG/ML IJ SOLN
INTRAMUSCULAR | Status: AC
  Filled 2013-12-31: qty 2

## 2013-12-31 MED ORDER — SODIUM CHLORIDE 0.9 % IV SOLN
1.0000 mL/kg/h | INTRAVENOUS | Status: AC
  Administered 2013-12-31: 1 mL/kg/h via INTRAVENOUS

## 2013-12-31 MED ORDER — MIDAZOLAM HCL 2 MG/2ML IJ SOLN
INTRAMUSCULAR | Status: AC
  Filled 2013-12-31: qty 2

## 2013-12-31 MED ORDER — NITROGLYCERIN 0.2 MG/ML ON CALL CATH LAB
INTRAVENOUS | Status: AC
  Filled 2013-12-31: qty 1

## 2013-12-31 MED ORDER — ACETAMINOPHEN 325 MG PO TABS
ORAL_TABLET | ORAL | Status: AC
  Filled 2013-12-31: qty 2

## 2013-12-31 MED ORDER — HEPARIN (PORCINE) IN NACL 2-0.9 UNIT/ML-% IJ SOLN
INTRAMUSCULAR | Status: AC
  Filled 2013-12-31: qty 500

## 2013-12-31 MED ORDER — HYDROCODONE-ACETAMINOPHEN 5-325 MG PO TABS
1.0000 | ORAL_TABLET | Freq: Three times a day (TID) | ORAL | Status: DC | PRN
  Administered 2013-12-31 – 2014-01-01 (×2): 1 via ORAL
  Filled 2013-12-31 (×2): qty 1

## 2013-12-31 MED ORDER — LIDOCAINE HCL (PF) 1 % IJ SOLN
INTRAMUSCULAR | Status: AC
  Filled 2013-12-31: qty 30

## 2013-12-31 MED ORDER — HEPARIN (PORCINE) IN NACL 2-0.9 UNIT/ML-% IJ SOLN
INTRAMUSCULAR | Status: AC
  Filled 2013-12-31: qty 1000

## 2013-12-31 NOTE — Interval H&P Note (Signed)
History and Physical Interval Note:  12/31/2013 8:10 AM  Christine Hughes  has presented today for surgery, with the diagnosis of c/p  The various methods of treatment have been discussed with the patient and family. After consideration of risks, benefits and other options for treatment, the patient has consented to  Procedure(s): LEFT HEART CATHETERIZATION WITH CORONARY/GRAFT ANGIOGRAM (N/A) as a surgical intervention .  The patient's history has been reviewed, patient examined, no change in status, stable for surgery.  I have reviewed the patient's chart and labs.  Questions were answered to the patient's satisfaction.    Please note that the patient has bilateral BKAs.  She had a NSTEMI at an outside hospital.  Renal failure improved.   Jettie Booze

## 2013-12-31 NOTE — Consult Note (Signed)
[address]       [place] [address]             [phone]        Reason for Consult: Severe native multi-vessel coronary disease and SVG disease s/p CABG in 1999. Referring Physician:  Dr. Casandra Doffing  Christine Hughes is an 68 y.o. female.  HPI:   The patient is a 68 year old woman with a long history of CAD s/p CABG in 1999 at [place], HTN, type 2 DM, PVD, bilateral BKA's and prior hip fracture with poor mobility, who was recently treated at [facility] for pneumonia, sepsis, and acute renal failure. She ruled in for STEMI at that time felt to be due to hypotenison. An echo showed an EF of 40-45% with inferior wall abnormality. After discharge there he family brought her to [facility] because they did not feel that she received adequate care. She has continued to have intermittent chest discomfort and dyspnea/orthopnea. Her BNP was 12,286 on admission her and she was treated with diuresis. Cardiac cath today by Dr. Irish Lack shows severe native 3 vessel CAD with an occluded mid LAD. There is a patent LIMA supplying the LAD and diagonal. The LCX is occluded at the ostium with an occluded SVG to an OM but there is no significant vessel seen in the LCX territory. The RCA has a high grade ostial stenosis. I can't tell how much stenosis there is but there was pressure dampening with a 5 F catheter. The remainder of the vessel is patent but diffusely diseased.   Past Medical History  Diagnosis Date  . Coronary artery disease     a. s/p prior MI;  b. CABG 1999 @ [facility]; c. s/p stenting 2009 or 10.  Marland Kitchen Hypertension   . Anxiety   . Depression   . Dementia   . Diabetes mellitus with complication     a. 12/812 DKA  . GERD (gastroesophageal reflux disease)     Bilateral BKAs  . H/O hiatal hernia   . Headache(784.0)   . Arthritis   . Anemia   . Cancer     L breast cancer  . Pulmonary embolism     a. prev coumadin  . Enterococcus UTI 12/19/2012  . Cardiomyopathy,  ischemic 12/12/2012    Ejection fraction 45%.  . DKA (diabetic ketoacidoses) 12/12/2012  . Encephalopathy acute 12/12/2012  . Thrombocytopenia 12/12/2012  . NSTEMI (non-ST elevated myocardial infarction) 12/12/2012  . Peripheral vascular disease     Status post bilateral BKA    Past Surgical History  Procedure Laterality Date  . Coronary angioplasty    . Back surgery    . Coronary artery bypass graft    . Appendectomy    . Abdominal hysterectomy    . Mastectomy    . Joint replacement      Hip  . Breast surgery      left  . Fracture surgery      R femur  . Cholecystectomy    . Tonsillectomy    . Leg amputation below knee Bilateral     History reviewed. No pertinent family history.  Social History:  reports that she has quit smoking. She has never used smokeless tobacco. She reports that she does not drink alcohol or use illicit drugs.  Allergies:  Allergies  Allergen Reactions  . Nitrofuran Derivatives Hives, Shortness Of Breath and Nausea And Vomiting  . Penicillins Shortness Of Breath and Rash  .  Sulfa Antibiotics Shortness Of Breath  . Ciprofloxacin Hcl Rash  . Quetiapine Fumarate Rash    Medications:  I have reviewed the patient's current medications. Prior to Admission:  Prescriptions prior to admission  Medication Sig Dispense Refill  . ALPRAZolam (XANAX) 0.5 MG tablet Take 0.5 mg by mouth 3 (three) times daily as needed for anxiety.       Marland Kitchen aspirin 81 MG chewable tablet Chew 1 tablet (81 mg total) by mouth daily.      Marland Kitchen atorvastatin (LIPITOR) 80 MG tablet Take 80 mg by mouth daily.      . carvedilol (COREG) 6.25 MG tablet Take 6.25 mg by mouth 2 (two) times daily with a meal.      . citalopram (CELEXA) 40 MG tablet Take 40 mg by mouth daily.      . clopidogrel (PLAVIX) 75 MG tablet Take 75 mg by mouth daily.      Marland Kitchen donepezil (ARICEPT) 10 MG tablet Take 10 mg by mouth daily.       Marland Kitchen esomeprazole (NEXIUM) 40 MG capsule Take 40 mg by mouth daily before breakfast.       . furosemide (LASIX) 20 MG tablet Take 1 tablet (20 mg total) by mouth daily.      . insulin NPH-regular (NOVOLIN 70/30) (70-30) 100 UNIT/ML injection Inject 34-48 Units into the skin. 48 units in AM, 34 units in PM      . isosorbide mononitrate (IMDUR) 30 MG 24 hr tablet Take 0.5 tablets (15 mg total) by mouth daily.      Marland Kitchen lisinopril (PRINIVIL,ZESTRIL) 2.5 MG tablet Take 2.5 mg by mouth daily.      . nitroGLYCERIN (NITROSTAT) 0.4 MG SL tablet Place 0.4 mg under the tongue every 5 (five) minutes as needed for chest pain.      . traZODone (DESYREL) 100 MG tablet Take 0.5 tablets (50 mg total) by mouth at bedtime.       Scheduled: . aspirin  81 mg Oral Daily  . atorvastatin  80 mg Oral Daily  . carvedilol  6.25 mg Oral BID WC  . citalopram  40 mg Oral Daily  . donepezil  10 mg Oral Daily  . insulin aspart  0-15 Units Subcutaneous TID WC  . insulin aspart protamine- aspart  34 Units Subcutaneous Q supper  . insulin aspart protamine- aspart  48 Units Subcutaneous Q breakfast  . isosorbide mononitrate  15 mg Oral Daily  . pantoprazole  40 mg Oral Daily  . sodium chloride  3 mL Intravenous Q12H  . traZODone  100 mg Oral QHS   Continuous: . sodium chloride 50 mL/hr at 12/31/13 0400  . sodium chloride 50 mL/hr at 12/31/13 0400  . sodium chloride 1 mL/kg/hr (12/31/13 0930)   TZG:YFVCBS chloride, acetaminophen, ALPRAZolam, HYDROcodone-acetaminophen, nitroGLYCERIN, sodium chloride  Results for orders placed during the hospital encounter of 12/25/13 (from the past 48 hour(s))  GLUCOSE, CAPILLARY     Status: Abnormal   Collection Time    12/29/13  4:29 PM      Result Value Ref Range   Glucose-Capillary 115 (*) 70 - 99 mg/dL   Comment 1 Documented in Chart     Comment 2 Notify RN    GLUCOSE, CAPILLARY     Status: Abnormal   Collection Time    12/29/13  9:43 PM      Result Value Ref Range   Glucose-Capillary 122 (*) 70 - 99 mg/dL   Comment 1 Notify RN  Comment 2 Documented in  Chart    BASIC METABOLIC PANEL     Status: Abnormal   Collection Time    12/30/13  4:16 AM      Result Value Ref Range   Sodium 140  137 - 147 mEq/L   Potassium 5.6 (*) 3.7 - 5.3 mEq/L   Chloride 102  96 - 112 mEq/L   CO2 21  19 - 32 mEq/L   Glucose, Bld 100 (*) 70 - 99 mg/dL   BUN 32 (*) 6 - 23 mg/dL   Creatinine, Ser 1.58 (*) 0.50 - 1.10 mg/dL   Calcium 9.7  8.4 - 10.5 mg/dL   GFR calc non Af Amer 32 (*) >90 mL/min   GFR calc Af Amer 37 (*) >90 mL/min   Comment: (NOTE)     The eGFR has been calculated using the CKD EPI equation.     This calculation has not been validated in all clinical situations.     eGFR's persistently <90 mL/min signify possible Chronic Kidney     Disease.  GLUCOSE, CAPILLARY     Status: Abnormal   Collection Time    12/30/13  5:59 AM      Result Value Ref Range   Glucose-Capillary 111 (*) 70 - 99 mg/dL  CBC     Status: Abnormal   Collection Time    12/30/13  6:15 AM      Result Value Ref Range   WBC 9.2  4.0 - 10.5 K/uL   RBC 3.45 (*) 3.87 - 5.11 MIL/uL   Hemoglobin 10.2 (*) 12.0 - 15.0 g/dL   HCT 32.4 (*) 36.0 - 46.0 %   MCV 93.9  78.0 - 100.0 fL   MCH 29.6  26.0 - 34.0 pg   MCHC 31.5  30.0 - 36.0 g/dL   RDW 15.6 (*) 11.5 - 15.5 %   Platelets 384  150 - 400 K/uL  HEPARIN LEVEL (UNFRACTIONATED)     Status: None   Collection Time    12/30/13  6:15 AM      Result Value Ref Range   Heparin Unfractionated 0.39  0.30 - 0.70 IU/mL   Comment:            IF HEPARIN RESULTS ARE BELOW     EXPECTED VALUES, AND PATIENT     DOSAGE HAS BEEN CONFIRMED,     SUGGEST FOLLOW UP TESTING     OF ANTITHROMBIN III LEVELS.  PROTIME-INR     Status: None   Collection Time    12/30/13  6:15 AM      Result Value Ref Range   Prothrombin Time 14.2  11.6 - 15.2 seconds   INR 1.12  0.00 - 1.49  GLUCOSE, CAPILLARY     Status: Abnormal   Collection Time    12/30/13 12:14 PM      Result Value Ref Range   Glucose-Capillary 113 (*) 70 - 99 mg/dL   Comment 1 Notify RN      GLUCOSE, CAPILLARY     Status: Abnormal   Collection Time    12/30/13  4:15 PM      Result Value Ref Range   Glucose-Capillary 182 (*) 70 - 99 mg/dL   Comment 1 Notify RN    GLUCOSE, CAPILLARY     Status: Abnormal   Collection Time    12/30/13  9:03 PM      Result Value Ref Range   Glucose-Capillary 120 (*) 70 - 99 mg/dL   Comment  1 Notify RN    CBC     Status: Abnormal   Collection Time    12/31/13  5:02 AM      Result Value Ref Range   WBC 8.8  4.0 - 10.5 K/uL   RBC 3.28 (*) 3.87 - 5.11 MIL/uL   Hemoglobin 9.6 (*) 12.0 - 15.0 g/dL   HCT 30.9 (*) 36.0 - 46.0 %   MCV 94.2  78.0 - 100.0 fL   MCH 29.3  26.0 - 34.0 pg   MCHC 31.1  30.0 - 36.0 g/dL   RDW 15.6 (*) 11.5 - 15.5 %   Platelets 321  150 - 400 K/uL  BASIC METABOLIC PANEL     Status: Abnormal   Collection Time    12/31/13  5:02 AM      Result Value Ref Range   Sodium 140  137 - 147 mEq/L   Potassium 5.0  3.7 - 5.3 mEq/L   Chloride 103  96 - 112 mEq/L   CO2 22  19 - 32 mEq/L   Glucose, Bld 118 (*) 70 - 99 mg/dL   BUN 36 (*) 6 - 23 mg/dL   Creatinine, Ser 1.61 (*) 0.50 - 1.10 mg/dL   Calcium 9.4  8.4 - 10.5 mg/dL   GFR calc non Af Amer 32 (*) >90 mL/min   GFR calc Af Amer 37 (*) >90 mL/min   Comment: (NOTE)     The eGFR has been calculated using the CKD EPI equation.     This calculation has not been validated in all clinical situations.     eGFR's persistently <90 mL/min signify possible Chronic Kidney     Disease.  GLUCOSE, CAPILLARY     Status: Abnormal   Collection Time    12/31/13  6:55 AM      Result Value Ref Range   Glucose-Capillary 125 (*) 70 - 99 mg/dL  GLUCOSE, CAPILLARY     Status: Abnormal   Collection Time    12/31/13  9:08 AM      Result Value Ref Range   Glucose-Capillary 125 (*) 70 - 99 mg/dL  GLUCOSE, CAPILLARY     Status: Abnormal   Collection Time    12/31/13 11:06 AM      Result Value Ref Range   Glucose-Capillary 118 (*) 70 - 99 mg/dL   Comment 1 Notify RN      No results  found.  Review of Systems  Constitutional: Positive for malaise/fatigue. Negative for fever, chills and weight loss.  HENT: Negative.   Eyes: Negative.   Respiratory: Positive for cough and shortness of breath. Negative for sputum production.   Cardiovascular: Positive for chest pain and orthopnea. Negative for leg swelling and PND.  Gastrointestinal: Positive for diarrhea.  Genitourinary: Negative.   Musculoskeletal: Positive for joint pain.  Skin: Negative.   Neurological: Negative.   Endo/Heme/Allergies: Negative.   Psychiatric/Behavioral: Positive for depression. The patient is nervous/anxious.        History of dementia   Blood pressure 131/50, pulse 86, temperature 98.1 F (36.7 C), temperature source Oral, resp. rate 18, height $RemoveBe'5\' 3"'[id number]$  (1.6 m), weight 66.9 kg (147 lb 7.8 oz), SpO2 98.00%. Physical Exam  Constitutional: She is oriented to person, place, and time.  Chronically ill-appearing woman in no distress  HENT:  Head: Normocephalic and atraumatic.  Mouth/Throat: Oropharynx is clear and moist.  Eyes: Conjunctivae and EOM are normal. Pupils are equal, round, and reactive to light.  Neck: Normal range of motion.  Neck supple. No JVD present. No thyromegaly present.  Cardiovascular: Normal rate, regular rhythm and normal heart sounds.   No murmur heard. Respiratory: Effort normal and breath sounds normal. No respiratory distress. She has no rales.  GI: Soft. Bowel sounds are normal. She exhibits no distension and no mass. There is no tenderness.  Musculoskeletal: She exhibits no edema.  Bilateral BKA.   Old vein harvest scar left thigh.  Lymphadenopathy:    She has no cervical adenopathy.  Neurological: She is alert and oriented to person, place, and time. She has normal strength. No cranial nerve deficit or sensory deficit.  Skin: Skin is warm and dry.  Psychiatric:  Flat affect but appropriate.      PROCEDURE: Left heart catheterization with selective coronary  angiography, left ventriculogram. Bypass graft angiography.  INDICATIONS: NSTEMI  The risks, benefits, and details of the procedure were explained to the patient. The patient verbalized understanding and wanted to proceed. Informed written consent was obtained.  PROCEDURE TECHNIQUE: After Xylocaine anesthesia a 57F sheath was placed in the right femoral artery with a single anterior needle wall stick. Left coronary angiography was done using a Judkins L4 guide catheter. Right coronary angiography was done using a Judkins R4 guide catheter. Internal mammary angiography was done using a LIMA catheter. SVGs were engaged using a JR 4 catheter. Left heart catheterization was done using a pigtail catheter.  CONTRAST: Total of 35 cc.  COMPLICATIONS: None.  HEMODYNAMICS: Aortic pressure was 93/41; LV pressure was 92/0; LVEDP 5. There was no gradient between the left ventricle and aorta.  ANGIOGRAPHIC DATA: The left main coronary artery is heavily calcified with moderate diffuse disease.  The left anterior descending artery is heavily calcified. Proximally, there is a 50% stenosis. There is diffuse moderate disease to the mid vessel. There is a diagonal vessel with severe diffuse disease proximally. After the origin of this diagonal, the LAD is occluded. The LAD is heavily calcified..  The left circumflex artery is occluded at the ostium. There is heavy calcium visible where the circumflex should be.  The right coronary artery is diffusely diseased in the proximal to midportion. There is an ostial 90% stenosis, with a 3 mm Hg pressure gradient. The posterior lateral artery is medium-sized and patent. There appear to be parallel posterior descending arteries which feed blood towards the apex. Both of these vessels are diffusely diseased but patent. The posterior lateral artery gives some collateral flow to the circumflex system.  The SVG to RCA is occluded.  The SVG to OM is occluded.  The LIMA to LAD is widely  patent. In the LAD beyond the insertion of the LIMA, there is a moderate lesion close to the apex. The distal LAD is feeding faint collaterals to the distal RCA system and faint collaterals to the distal circumflex system.  LEFT VENTRICULOGRAM: Left ventricular angiogram was not done. LVEDP was 5 mmHg.  IMPRESSIONS:  1. Heavily calcified but patent left main coronary artery. 2. Occluded mid left anterior descending artery. Patent LIMA to LAD. Moderate disease in the LAD beyond the insertion of the LIMA. 3. Occluded left circumflex artery. Occluded SVG to OM. 4. 90% ostial right coronary artery stenosis with moderate diffuse disease in the proximal to midportion of the vessel. Occluded SVG RCA. 5. Left ventricular systolic function not assessed due to renal insufficiency. LVEDP 5 mmHg.  RECOMMENDATION: Severe three-vessel native coronary artery disease. 2 of the 3 bypass grafts are now occluded. The only potential targets for revascularization is  the ostial right coronary artery. However, the entire vessel is diffusely diseased. The circumflex anatomy is unfavorable to percutaneous revascularization. I discussed these findings with the patient. She would like to be considered for redo coronary artery bypass surgery. Will have the surgeon see the patient and we will come up with a plan depending on their opinion.     Assessment/Plan:  I have reviewed her cath films and prior echo, medical records, and have interviewed and examined the patient. She has severe native 3 vessel coronary artery disease and occluded SVG's with a patent LIMA s/p CABG in 1999. She has had intermittent chest discomfort at home but it doesn't sound like it happens very often, maybe monthly, and usually resolves without treatment. She is immobile due to bilateral BKA's. I agree that her recent NSTEMI was probably due to demand ischemia and hypotension from sepsis. I don't see any vessel to graft in the LCX territory. The LIMA to  the LAD is fine. The RCA is patent but probably has a significant ostial stenosis. I don't think redo CABG is an option for this patient due to her multiple medical problems including bilateral BKA and severe hip OA preventing mobility, diabetes, and chronic kidney disease. Her operative risk would be very high and it would probably not improve her quality of life or mobility. I would recommend consideration of PCI to the ostium of the RCA or medical therapy. I reviewed all of this with the patient and her family and answered their questions. Dr. Irish Lack will follow up with them.  Gaye Pollack 12/31/2013, 12:36 PM

## 2013-12-31 NOTE — H&P (View-Only) (Signed)
Patient Name: Christine Hughes Date of Encounter: 12/30/2013   Principal Problem:   Acute on chronic systolic congestive heart failure, NYHA class 4 Active Problems:   Cardiomyopathy, ischemic   CAD (coronary artery disease)   Hx of CABG   Midsternal chest pain   DM type 1 (diabetes mellitus, type 1)   CKD (chronic kidney disease) stage 3, GFR 30-59 ml/min   Anemia   Depression with anxiety   Hypertension   Hyperlipidemia   Diarrhea   Hyperkalemia    SUBJECTIVE  Mild recurrent chest pain, no SOB.   CURRENT MEDS  . aspirin  81 mg Oral Daily  . atorvastatin  80 mg Oral Daily  . carvedilol  6.25 mg Oral BID WC  . citalopram  40 mg Oral Daily  . clopidogrel  75 mg Oral Q breakfast  . donepezil  10 mg Oral Daily  . furosemide  40 mg Oral BID  . insulin aspart  0-15 Units Subcutaneous TID WC  . insulin aspart protamine- aspart  34 Units Subcutaneous Q supper  . insulin aspart protamine- aspart  48 Units Subcutaneous Q breakfast  . isosorbide mononitrate  15 mg Oral Daily  . pantoprazole  40 mg Oral Daily  . sodium chloride  3 mL Intravenous Q12H  . sodium chloride  3 mL Intravenous Q12H  . traZODone  100 mg Oral QHS   OBJECTIVE  Filed Vitals:   12/29/13 2029 12/29/13 2039 12/30/13 0706 12/30/13 1053  BP: 103/35 110/54 125/48 112/37  Pulse: 71  86 70  Temp: 98.8 F (37.1 C)  99.6 F (37.6 C)   TempSrc: Tympanic  Oral   Resp: 18  18   Height:      Weight:   146 lb 6.4 oz (66.407 kg)   SpO2: 97%  96% 100%    Intake/Output Summary (Last 24 hours) at 12/30/13 1149 Last data filed at 12/30/13 0847  Gross per 24 hour  Intake    627 ml  Output   2251 ml  Net  -1624 ml   Filed Weights   12/29/13 0647 12/29/13 0734 12/30/13 0706  Weight: 148 lb 1.6 oz (67.178 kg) 148 lb 1.6 oz (67.178 kg) 146 lb 6.4 oz (66.407 kg)    PHYSICAL EXAM  General: Pleasant, NAD. Neuro: Alert and oriented X 3. Moves all extremities spontaneously. Psych: Normal affect. HEENT:   Normal  Neck: Supple without bruits or JVD. Lungs:  Resp regular and unlabored, CTA. Heart: RRR no s3, s4, or murmurs. Abdomen: Soft, non-tender, non-distended, BS + x 4.  Extremities: No clubbing, cyanosis or edema. DP/PT/Radials 2+ and equal bilaterally.  Accessory Clinical Findings  CBC  Recent Labs  12/29/13 0515 12/30/13 0615  WBC 8.2 9.2  HGB 9.7* 10.2*  HCT 30.6* 32.4*  MCV 94.4 93.9  PLT 358 350   Basic Metabolic Panel  Recent Labs  12/29/13 0515 12/30/13 0416  NA 142 140  K 5.5* 5.6*  CL 104 102  CO2 24 21  GLUCOSE 90 100*  BUN 27* 32*  CREATININE 1.51* 1.58*  CALCIUM 9.8 9.7   Cardiac Enzymes  Recent Labs  12/27/13 1154 12/27/13 1815 12/28/13 0115  TROPONINI <0.30 <0.30 <0.30   TELE: SR  Radiology/Studies  Dg Chest 2 View  12/25/2013   CLINICAL DATA:  Shortness of breath.  EXAM: CHEST  2 VIEW  COMPARISON:  12/12/2012  FINDINGS: The heart is enlarged but stable. Stable surgical changes from bypass surgery. There are chronic bronchitic type lung  changes with probable superimposed bronchitis or interstitial pneumonitis. No focal airspace consolidation or pleural effusion.  IMPRESSION: Chronic underlying lung changes with probable superimposed bronchitis.   Electronically Signed   By: Kalman Jewels M.D.   On: 12/25/2013 18:45   ASSESSMENT AND PLAN  1. Acute on chronic systolic chf/ICM:  EF 83-09% by echo report from earlier this year in [place].  - 934 overnight and -9 L for this admission.  Wt down 10 lbs since admission.  2.  CAD:  S/p recent nstemi with recurrent chest pain here.  Cath previously deferred 2/2 acute renal failure in the setting of sepsis/pna, with creat rising to 5.  Plan for cath today however creat slightly higher than yesterday @ 1.58.  Cath deferred.  Will hold lisinopril.  3.  DM:  Sugars stable.  Cont current regimen.  4.  CKD III: creat up slightly.  Hold ace inhibitor.  5.  HTN:  Stable.  6.  HL: on statin.  LFT's  wnl.  7. Diarrhea:  GI seeing.  Appreciate input.  Signed, Rogelia Mire NP  The patient was seen, examined and discussed with Carmela Rima, NP and agree as above.  Awaiting cath for recurrent chest pain. On iv Heparin, on and off chest pain, would stop Heparin. Slightly worsened Crea today. We will hold diuretics and ACEI prior to the cath. We will start NS at 50 cc/hr in the am prior to the cath.   [name] 12/30/2013

## 2013-12-31 NOTE — CV Procedure (Addendum)
PROCEDURE:  Left heart catheterization with selective coronary angiography, left ventriculogram.  Bypass graft angiography.  INDICATIONS:  NSTEMI  The risks, benefits, and details of the procedure were explained to the patient.  The patient verbalized understanding and wanted to proceed.  Informed written consent was obtained.  PROCEDURE TECHNIQUE:  After Xylocaine anesthesia a 61F sheath was placed in the right femoral artery with a single anterior needle wall stick.   Left coronary angiography was done using a Judkins L4 guide catheter.  Right coronary angiography was done using a Judkins R4 guide catheter. Internal mammary angiography was done using a LIMA catheter. SVGs were engaged using a JR 4 catheter. Left heart catheterization was done using a pigtail catheter.    CONTRAST:  Total of 35 cc.  COMPLICATIONS:  None.    HEMODYNAMICS:  Aortic pressure was 93/41; LV pressure was 92/0; LVEDP 5.  There was no gradient between the left ventricle and aorta.    ANGIOGRAPHIC DATA:   The left main coronary artery is heavily calcified with moderate diffuse disease.  The left anterior descending artery is heavily calcified. Proximally, there is a 50% stenosis. There is diffuse moderate disease to the mid vessel. There is a diagonal vessel with severe diffuse disease proximally. After the origin of this diagonal, the LAD is occluded. The LAD is heavily calcified..  The left circumflex artery is occluded at the ostium. There is heavy calcium visible where the circumflex should be.  The right coronary artery is diffusely diseased in the proximal to midportion. There is an ostial 90% stenosis, with a 3 mm Hg pressure gradient. The posterior lateral artery is medium-sized and patent. There appear to be parallel posterior descending arteries which feed blood towards the apex. Both of these vessels are diffusely diseased but patent. The posterior lateral artery gives some collateral flow to the circumflex  system.  The SVG to RCA is occluded.  The SVG to OM is occluded.  The LIMA to LAD is widely patent. In the LAD beyond the insertion of the LIMA, there is a moderate lesion close to the apex. The distal LAD is feeding faint collaterals to the distal RCA system and faint collaterals to the distal circumflex system.  LEFT VENTRICULOGRAM:  Left ventricular angiogram was not done.  LVEDP was 5 mmHg.  IMPRESSIONS:  1. Heavily calcified but patent left main coronary artery. 2. Occluded mid left anterior descending artery.  Patent LIMA to LAD. Moderate disease in the LAD beyond the insertion of the LIMA. 3. Occluded left circumflex artery.  Occluded SVG to OM. 4. 90% ostial right coronary artery stenosis with moderate diffuse disease in the proximal to midportion of the vessel.  Occluded SVG RCA. 5. Left ventricular systolic function not assessed due to renal insufficiency.  LVEDP 5 mmHg.   RECOMMENDATION:  Severe three-vessel native coronary artery disease. 2 of the 3 bypass grafts are now occluded. The only potential targets for revascularization is the ostial right coronary artery. However, the entire vessel is diffusely diseased. The circumflex anatomy is unfavorable to percutaneous revascularization.  I discussed these findings with the patient. She would like to be considered for redo coronary artery bypass surgery. Will have the surgeon see the patient and we will come up with a plan depending on their opinion.    ADDENDUM: I spoke to the patient's family and the patient again at length a few hours after the procedure. We discussed the fact that he was not a candidate for bypass surgery.  I discussed both options of medical therapy versus PCI. We went over all the risks of PCI including renal failure, bleeding, aortic dissection since this is an ostial right coronary artery lesion, need for emergent surgery. Her symptoms are somewhat vague. I suggested medical therapy unless they were refractory  symptoms. Will have some of my interventional colleagues look at the pictures to get their opinion. She'll also think about her options.  Jettie Booze

## 2014-01-01 DIAGNOSIS — I441 Atrioventricular block, second degree: Secondary | ICD-10-CM | POA: Diagnosis present

## 2014-01-01 LAB — CBC
HCT: 29.8 % — ABNORMAL LOW (ref 36.0–46.0)
HEMOGLOBIN: 9.2 g/dL — AB (ref 12.0–15.0)
MCH: 29.2 pg (ref 26.0–34.0)
MCHC: 30.9 g/dL (ref 30.0–36.0)
MCV: 94.6 fL (ref 78.0–100.0)
Platelets: 272 10*3/uL (ref 150–400)
RBC: 3.15 MIL/uL — AB (ref 3.87–5.11)
RDW: 15.7 % — ABNORMAL HIGH (ref 11.5–15.5)
WBC: 8.3 10*3/uL (ref 4.0–10.5)

## 2014-01-01 LAB — GLUCOSE, CAPILLARY
GLUCOSE-CAPILLARY: 156 mg/dL — AB (ref 70–99)
GLUCOSE-CAPILLARY: 75 mg/dL (ref 70–99)
GLUCOSE-CAPILLARY: 152 mg/dL — AB (ref 70–99)
Glucose-Capillary: 84 mg/dL (ref 70–99)
Glucose-Capillary: 274 mg/dL — ABNORMAL HIGH (ref 70–99)
Glucose-Capillary: 65 mg/dL — ABNORMAL LOW (ref 70–99)

## 2014-01-01 LAB — BASIC METABOLIC PANEL
BUN: 33 mg/dL — ABNORMAL HIGH (ref 6–23)
CALCIUM: 9.7 mg/dL (ref 8.4–10.5)
CO2: 20 meq/L (ref 19–32)
CREATININE: 1.29 mg/dL — AB (ref 0.50–1.10)
Chloride: 106 mEq/L (ref 96–112)
GFR calc Af Amer: 48 mL/min — ABNORMAL LOW (ref >90)
GFR calc non Af Amer: 41 mL/min — ABNORMAL LOW (ref >90)
Glucose, Bld: 206 mg/dL — ABNORMAL HIGH (ref 70–99)
Potassium: 5.8 mEq/L — ABNORMAL HIGH (ref 3.7–5.3)
Sodium: 142 mEq/L (ref 137–147)

## 2014-01-01 MED ORDER — POLYETHYLENE GLYCOL 3350 17 G PO PACK
17.0000 g | PACK | Freq: Two times a day (BID) | ORAL | Status: DC | PRN
  Administered 2014-01-01: 17 g via ORAL
  Filled 2014-01-01 (×2): qty 1

## 2014-01-01 MED ORDER — CITALOPRAM HYDROBROMIDE 20 MG PO TABS
20.0000 mg | ORAL_TABLET | Freq: Every day | ORAL | Status: DC
  Administered 2014-01-02 – 2014-01-04 (×3): 20 mg via ORAL
  Filled 2014-01-01 (×3): qty 1

## 2014-01-01 NOTE — Progress Notes (Addendum)
Patient: Christine Hughes / Admit Date: 12/25/2013 / Date of Encounter: 01/01/2014, 9:50 AM  Subjective  Denies any CP or SOB. Just uncomfortable from laying in the bed.  Objective   Telemetry: NSR 1st degree AVB Yesterday @ 11:30 - brief [place], then consistently prolonged PR interval with transient subsequent 2:1 block  Physical Exam: Blood pressure 122/45, pulse 81, temperature 98 F (36.7 C), temperature source Oral, resp. rate 18, height 5\' 3"  (1.6 m), weight 148 lb 2.4 oz (67.2 kg), SpO2 97.00%. General: Well developed, well nourished WF in no acute distress. Head: Normocephalic, atraumatic, sclera non-icteric, no xanthomas, nares are without discharge. Neck: JVP not elevated. Lungs: Clear bilaterally to auscultation without wheezes, rales, or rhonchi. Breathing is unlabored. Heart: RRR S1 split S2 without murmurs, rubs, or gallops.  Abdomen: Soft, non-tender, non-distended with normoactive bowel sounds. No rebound/guarding. Extremities: No clubbing or cyanosis. S/p bilat BKA. No edema.  Neuro: Alert and oriented X 3. Moves all extremities spontaneously. Psych:  Responds to questions appropriately with a normal affect.  Intake/Output Summary (Last 24 hours) at 01/01/14 0950 Last data filed at 01/01/14 2542  Gross per 24 hour  Intake   1563 ml  Output   2675 ml  Net  -1112 ml    Inpatient Medications:  . aspirin  81 mg Oral Daily  . atorvastatin  80 mg Oral Daily  . carvedilol  6.25 mg Oral BID WC  . citalopram  40 mg Oral Daily  . donepezil  10 mg Oral Daily  . insulin aspart  0-15 Units Subcutaneous TID WC  . insulin aspart protamine- aspart  34 Units Subcutaneous Q supper  . insulin aspart protamine- aspart  48 Units Subcutaneous Q breakfast  . isosorbide mononitrate  15 mg Oral Daily  . pantoprazole  40 mg Oral Daily  . sodium chloride  3 mL Intravenous Q12H  . traZODone  100 mg Oral QHS   Infusions:    Labs:  Recent Labs  12/30/13 0416 12/31/13 0502  NA  140 140  K 5.6* 5.0  CL 102 103  CO2 21 22  GLUCOSE 100* 118*  BUN 32* 36*  CREATININE 1.58* 1.61*  CALCIUM 9.7 9.4   No results found for this basename: AST, ALT, ALKPHOS, BILITOT, PROT, ALBUMIN,  in the last 72 hours  Recent Labs  12/31/13 0502 01/01/14 0317  WBC 8.8 8.3  HGB 9.6* 9.2*  HCT 30.9* 29.8*  MCV 94.2 94.6  PLT 321 272    Radiology/Studies:  Dg Chest 2 View  12/25/2013   CLINICAL DATA:  Shortness of breath.  EXAM: CHEST  2 VIEW  COMPARISON:  12/12/2012  FINDINGS: The heart is enlarged but stable. Stable surgical changes from bypass surgery. There are chronic bronchitic type lung changes with probable superimposed bronchitis or interstitial pneumonitis. No focal airspace consolidation or pleural effusion.  IMPRESSION: Chronic underlying lung changes with probable superimposed bronchitis.   Electronically Signed   By: Kalman Jewels M.D.   On: 12/25/2013 18:45     Assessment and Plan   1. Acute on chronic systolic CHF/ICM: EF 70-62% by echo report from earlier this year in [place]. Currently compensated. Hold off on r/s ACEI and Lasix until we know more about post-cath renal function.  2. CAD s/p prior CABG: Recent NSTEMI with recurrent chest pain here. Cath previously deferred 2/2 acute renal failure in the setting of sepsis/PNA with Cr rising to 5. Cath yesterday - 3V with 2/3 grafts occluded. Not felt to be  a redo-CABG candidate due to multiple medical problems including bilateral BKA and severe hip OA preventing mobility, diabetes, and chronic kidney disease. Awaiting word from Dr. Irish Lack about further method of treatment - PCI vs med rx.  3. Bradycardia with 2nd degree heart block: transient yesterday with brief 2:1 pattern. Will d/w MD regarding need to discontinue vs decrease beta blocker (received this AM). 4. DM: Sugars stable. Cont current regimen. S/p bilateral BKA. 5. CKD III with hyperkalemia this admission: BMET ordered this AM. Will need to assess Cr  stability post-cath. Will not restart ACEI yet. 6. HTN: Stable.  7. HL: on statin. LFT's wnl.  8. Diarrhea: felt chronic and multifactorial. No apparent further GI workup planned at this time. 9. Chronic anemia: appears stable.  Signed, Melina Copa PA-C  The patient was seen, examined and discussed with Melina Copa, PA-C and I agree with the above.   68 year old female with known CAD, prior CABG, cath yesterday showed 3 VD, 2/3 grafts occluded, we are awaiting final decision about stenting vs medical tx. Not CABG candidate. CHF - compensated, we will follow Crea to monitor contrast nephropathy, hold ACEI for now.  Hold diuretics, restart home dose at the discharge.   [name] 01/01/2014

## 2014-01-02 DIAGNOSIS — I2589 Other forms of chronic ischemic heart disease: Secondary | ICD-10-CM

## 2014-01-02 LAB — BASIC METABOLIC PANEL
BUN: 35 mg/dL — ABNORMAL HIGH (ref 6–23)
CO2: 21 mEq/L (ref 19–32)
Calcium: 9.6 mg/dL (ref 8.4–10.5)
Chloride: 100 mEq/L (ref 96–112)
Creatinine, Ser: 1.25 mg/dL — ABNORMAL HIGH (ref 0.50–1.10)
GFR calc Af Amer: 50 mL/min — ABNORMAL LOW (ref >90)
GFR calc non Af Amer: 43 mL/min — ABNORMAL LOW (ref >90)
Glucose, Bld: 201 mg/dL — ABNORMAL HIGH (ref 70–99)
Potassium: 4.8 mEq/L (ref 3.7–5.3)
Sodium: 138 mEq/L (ref 137–147)

## 2014-01-02 LAB — URINE MICROSCOPIC-ADD ON

## 2014-01-02 LAB — URINALYSIS, ROUTINE W REFLEX MICROSCOPIC
Bilirubin Urine: NEGATIVE
GLUCOSE, UA: NEGATIVE mg/dL
Ketones, ur: NEGATIVE mg/dL
Nitrite: NEGATIVE
Protein, ur: NEGATIVE mg/dL
SPECIFIC GRAVITY, URINE: 1.013 (ref 1.005–1.030)
Urobilinogen, UA: 0.2 mg/dL (ref 0.0–1.0)
pH: 6.5 (ref 5.0–8.0)

## 2014-01-02 LAB — CBC
HEMATOCRIT: 31.1 % — AB (ref 36.0–46.0)
Hemoglobin: 9.5 g/dL — ABNORMAL LOW (ref 12.0–15.0)
MCH: 28.9 pg (ref 26.0–34.0)
MCHC: 30.5 g/dL (ref 30.0–36.0)
MCV: 94.5 fL (ref 78.0–100.0)
Platelets: 269 10*3/uL (ref 150–400)
RBC: 3.29 MIL/uL — AB (ref 3.87–5.11)
RDW: 15.6 % — AB (ref 11.5–15.5)
WBC: 8.1 10*3/uL (ref 4.0–10.5)

## 2014-01-02 LAB — GLUCOSE, CAPILLARY
GLUCOSE-CAPILLARY: 126 mg/dL — AB (ref 70–99)
GLUCOSE-CAPILLARY: 165 mg/dL — AB (ref 70–99)
GLUCOSE-CAPILLARY: 92 mg/dL (ref 70–99)
Glucose-Capillary: 113 mg/dL — ABNORMAL HIGH (ref 70–99)
Glucose-Capillary: 48 mg/dL — ABNORMAL LOW (ref 70–99)

## 2014-01-02 MED ORDER — ISOSORBIDE MONONITRATE ER 30 MG PO TB24
30.0000 mg | ORAL_TABLET | Freq: Every day | ORAL | Status: DC
  Administered 2014-01-03 – 2014-01-04 (×2): 30 mg via ORAL
  Filled 2014-01-02 (×2): qty 1

## 2014-01-02 MED ORDER — GLUCOSE 40 % PO GEL
ORAL | Status: AC
Start: 2014-01-02 — End: 2014-01-02
  Administered 2014-01-02: 22:00:00
  Filled 2014-01-02: qty 1

## 2014-01-02 MED ORDER — GLUCOSE-VITAMIN C 4-6 GM-MG PO CHEW: 4.0000 | CHEWABLE_TABLET | ORAL | Status: DC | PRN

## 2014-01-02 MED ORDER — GLUCOSE 40 % PO GEL: 1.0000 | ORAL | Status: DC | PRN

## 2014-01-02 MED ORDER — DEXTROSE 50 % IV SOLN: 50.0000 mL | Freq: Once | INTRAVENOUS | Status: AC | PRN

## 2014-01-02 MED ORDER — DEXTROSE 50 % IV SOLN: 25.0000 mL | Freq: Once | INTRAVENOUS | Status: AC | PRN

## 2014-01-02 MED ORDER — RANOLAZINE ER 500 MG PO TB12
500.0000 mg | ORAL_TABLET | Freq: Two times a day (BID) | ORAL | Status: DC
  Administered 2014-01-02 – 2014-01-04 (×5): 500 mg via ORAL
  Filled 2014-01-02 (×6): qty 1

## 2014-01-02 NOTE — Progress Notes (Signed)
Patient: Christine Hughes / Admit Date: 12/25/2013 / Date of Encounter: 01/02/2014, 10:19 AM  Subjective  Denies any SOB. Chest pressure at 3 am. Asymptomatic now.  Objective   Telemetry: NSR 1st degree AVB Yesterday @ 11:30 - brief Christine Hughes, then consistently prolonged PR interval with transient subsequent 2:1 block  Physical Exam: Blood pressure 144/58, pulse 77, temperature 98.9 F (37.2 C), temperature source Oral, resp. rate 18, height 5\' 3"  (1.6 m), weight 146 lb 14.4 oz (66.633 kg), SpO2 100.00%.  General: Well developed, well nourished WF in no acute distress. Head: Normocephalic, atraumatic, sclera non-icteric, no xanthomas, nares are without discharge. Neck: JVP not elevated. Lungs: Clear bilaterally to auscultation without wheezes, rales, or rhonchi. Breathing is unlabored. Heart: RRR S1 split S2 without murmurs, rubs, or gallops.  Abdomen: Soft, non-tender, non-distended with normoactive bowel sounds. No rebound/guarding. Extremities: No clubbing or cyanosis. S/p bilat BKA. No edema.  Neuro: Alert and oriented X 3. Moves all extremities spontaneously. Psych:  Responds to questions appropriately with a normal affect.  Intake/Output Summary (Last 24 hours) at 01/02/14 1019 Last data filed at 01/02/14 0831  Gross per 24 hour  Intake    900 ml  Output   1850 ml  Net   -950 ml    Inpatient Medications:  . aspirin  81 mg Oral Daily  . atorvastatin  80 mg Oral Daily  . carvedilol  6.25 mg Oral BID WC  . citalopram  20 mg Oral Daily  . donepezil  10 mg Oral Daily  . insulin aspart  0-15 Units Subcutaneous TID WC  . insulin aspart protamine- aspart  34 Units Subcutaneous Q supper  . insulin aspart protamine- aspart  48 Units Subcutaneous Q breakfast  . isosorbide mononitrate  15 mg Oral Daily  . pantoprazole  40 mg Oral Daily  . sodium chloride  3 mL Intravenous Q12H  . traZODone  100 mg Oral QHS   Infusions:    Labs:  Recent Labs  12/31/13 0502 01/01/14 0930    NA 140 142  K 5.0 5.8*  CL 103 106  CO2 22 20  GLUCOSE 118* 206*  BUN 36* 33*  CREATININE 1.61* 1.29*  CALCIUM 9.4 9.7   Recent Labs  01/01/14 0317 01/02/14 0355  WBC 8.3 8.1  HGB 9.2* 9.5*  HCT 29.8* 31.1*  MCV 94.6 94.5  PLT 272 269   Radiology/Studies:  Dg Chest 2 View  12/25/2013   CLINICAL DATA:  Shortness of breath.  EXAM: CHEST  2 VIEW  COMPARISON:  12/12/2012  FINDINGS: The heart is enlarged but stable. Stable surgical changes from bypass surgery. There are chronic bronchitic type lung changes with probable superimposed bronchitis or interstitial pneumonitis. No focal airspace consolidation or pleural effusion.  IMPRESSION: Chronic underlying lung changes with probable superimposed bronchitis.   Electronically Signed   By: Kalman Jewels M.D.   On: 12/25/2013 18:45     Assessment and Plan   1. Acute on chronic systolic CHF/ICM: EF 22-97% by echo report from earlier this year in [place]. Currently compensated. Hold off on r/s ACEI and Lasix until we know more about post-cath renal function.  2. CAD s/p prior CABG: Recent NSTEMI with recurrent chest pain here. Cath previously deferred 2/2 acute renal failure in the setting of sepsis/PNA with Cr rising to 5. Cath yesterday - 3V with 2/3 grafts occluded. Not felt to be a redo-CABG candidate due to multiple medical problems including bilateral BKA and severe hip OA preventing mobility,  diabetes, and chronic kidney disease.  Dr Irish Lack would prefer a trial of medical therapy and if fails possible PCI.  I had a long discussion with the patient and her family that state that her symptoms are rather sporadic and they agree. We would start ranolazine 500 mg po BID and increase Imdur to 30 mg po daily.   3. Bradycardia with 2nd degree heart block: transient yesterday with brief 2:1 pattern. Will d/w MD regarding need to discontinue vs decrease beta blocker (received this AM). 4. DM: Sugars stable. Cont current regimen. S/p bilateral  BKA. 5. CKD III with hyperkalemia this admission: improving Crea, we will continue to monitor for possible contrast nepropathy. Will not restart ACEI yet and continue to hold diuretics, restart tomorrow at discharge. 6. HTN: Stable.  7. HL: on statin. LFT's wnl.  8. Diarrhea: felt chronic and multifactorial. No apparent further GI workup planned at this time. 9. Chronic anemia: appears stable.  Christine Hughes 01/02/2014

## 2014-01-03 LAB — GLUCOSE, CAPILLARY
GLUCOSE-CAPILLARY: 92 mg/dL (ref 70–99)
GLUCOSE-CAPILLARY: 117 mg/dL — AB (ref 70–99)
Glucose-Capillary: 109 mg/dL — ABNORMAL HIGH (ref 70–99)
Glucose-Capillary: 74 mg/dL (ref 70–99)

## 2014-01-03 LAB — CBC
HEMATOCRIT: 31.9 % — AB (ref 36.0–46.0)
HEMOGLOBIN: 10 g/dL — AB (ref 12.0–15.0)
MCH: 29.6 pg (ref 26.0–34.0)
MCHC: 31.3 g/dL (ref 30.0–36.0)
MCV: 94.4 fL (ref 78.0–100.0)
Platelets: 261 10*3/uL (ref 150–400)
RBC: 3.38 MIL/uL — AB (ref 3.87–5.11)
RDW: 15.7 % — AB (ref 11.5–15.5)
WBC: 9.8 10*3/uL (ref 4.0–10.5)

## 2014-01-03 MED ORDER — BISACODYL 5 MG PO TBEC
5.0000 mg | DELAYED_RELEASE_TABLET | Freq: Every day | ORAL | Status: DC | PRN
  Administered 2014-01-03 – 2014-01-04 (×2): 5 mg via ORAL
  Filled 2014-01-03 (×2): qty 1

## 2014-01-03 NOTE — Progress Notes (Signed)
Patient: Christine Hughes / Admit Date: 12/25/2013 / Date of Encounter: 01/03/2014, 11:03 AM  Subjective  No CP. Generally weak though. Had long discussion with pt and daughter about disposition. Agree with the need for PT to assess whether she is strong enough to continue to live independently vs rehab. She would also like to see the GI doctor again for questions regarding her chronic diarrhea.  Objective   Telemetry: NSR 1st degree AVB  Physical Exam: Blood pressure 125/51, pulse 88, temperature 98.1 F (36.7 C), temperature source Oral, resp. rate 18, height 5\' 3"  (1.6 m), weight 146 lb 9.7 oz (66.5 kg), SpO2 97.00%. General: Well developed, well nourished WF in no acute distress. Head: Normocephalic, atraumatic, sclera non-icteric, no xanthomas, nares are without discharge. Neck: Negative for carotid bruits. JVP not elevated. Lungs: Clear bilaterally to auscultation without wheezes, rales, or rhonchi. Breathing is unlabored. Heart: RRR S1 S2 without murmurs, rubs, or gallops.  Abdomen: Soft, non-tender, non-distended with normoactive bowel sounds. No rebound/guarding. Extremities: No clubbing or cyanosis. S/p bilat BKA. No edema.  Neuro: Alert and oriented X 3. Moves all extremities spontaneously. Psych:  Responds to questions appropriately with a normal affect.   Intake/Output Summary (Last 24 hours) at 01/03/14 1103 Last data filed at 01/03/14 1023  Gross per 24 hour  Intake   1320 ml  Output   1900 ml  Net   -580 ml    Inpatient Medications:  . aspirin  81 mg Oral Daily  . atorvastatin  80 mg Oral Daily  . carvedilol  6.25 mg Oral BID WC  . citalopram  20 mg Oral Daily  . donepezil  10 mg Oral Daily  . insulin aspart  0-15 Units Subcutaneous TID WC  . insulin aspart protamine- aspart  34 Units Subcutaneous Q supper  . insulin aspart protamine- aspart  48 Units Subcutaneous Q breakfast  . isosorbide mononitrate  30 mg Oral Daily  . pantoprazole  40 mg Oral Daily  .  ranolazine  500 mg Oral BID  . sodium chloride  3 mL Intravenous Q12H  . traZODone  100 mg Oral QHS   Infusions:    Labs:  Recent Labs  01/01/14 0930 01/02/14 1055  NA 142 138  K 5.8* 4.8  CL 106 100  CO2 20 21  GLUCOSE 206* 201*  BUN 33* 35*  CREATININE 1.29* 1.25*  CALCIUM 9.7 9.6   No results found for this basename: AST, ALT, ALKPHOS, BILITOT, PROT, ALBUMIN,  in the last 72 hours  Recent Labs  01/02/14 0355 01/03/14 0500  WBC 8.1 9.8  HGB 9.5* 10.0*  HCT 31.1* 31.9*  MCV 94.5 94.4  PLT 269 261    Radiology/Studies:  Dg Chest 2 View  12/25/2013   CLINICAL DATA:  Shortness of breath.  EXAM: CHEST  2 VIEW  COMPARISON:  12/12/2012  FINDINGS: The heart is enlarged but stable. Stable surgical changes from bypass surgery. There are chronic bronchitic type lung changes with probable superimposed bronchitis or interstitial pneumonitis. No focal airspace consolidation or pleural effusion.  IMPRESSION: Chronic underlying lung changes with probable superimposed bronchitis.   Electronically Signed   By: Kalman Jewels M.D.   On: 12/25/2013 18:45     Assessment and Plan  1. Acute on chronic systolic CHF/ICM: EF 69-62% by echo report from earlier this year in [place]  2. CAD s/p prior CABG: Recent NSTEMI with recurrent chest pain here, cath 4/14 with 3V with 2/3 grafts occluded, not felt to be a  redo CABG candidate  3. Bradycardia with 2nd degree heart block: transient 12/31/13 with brief 2:1 pattern 4. DM s/p bilat BKA  5. CKD III with hyperkalemia this admission  6. HTN  7. HL  8. Diarrhea, felt chronic and multifactorial, no apparent further GI workup planned at this time.  9. Chronic anemia, appears stable  See note yesterday. Medical therapy to be continued at this time.  Long discussion with daughter and pt. They are concerned for premature discharge since she has not been out of bed and lives so far away. Plan PT eval today and repeat session tomorrow to assess dispo  needs. Will also ask Dr. Benson Norway to come by (see GI notes regarding him following up).  See previous notes regarding 2nd degree heart block. Will await MD decision regarding beta blocker.  Signed, Melina Copa PA-C  The patient was seen, examined and discussed with Melina Copa, PA-C and I agree with the above.   Recent NSTEMI with recurrent chest pain here. Cath previously deferred 2/2 acute renal failure in the setting of sepsis/PNA with Cr rising to 5. Cath yesterday - 3V with 2/3 grafts occluded. Not felt to be a redo-CABG candidate due to multiple medical problems including bilateral BKA and severe hip OA preventing mobility, diabetes, and chronic kidney disease.  Dr Irish Lack would prefer a trial of medical therapy and if fails possible PCI.  I had a long discussion with the patient and her family and phone call with her daughter that state that her symptoms are rather sporadic and they agree. Started on  ranolazine 500 mg po BID and increase Imdur to 30 mg po daily with improvement of CP, currently asymptomatic.   The patient is very reluctant to leave as she lives alone, she wants to walk with PT before discharge. Otherwise ok from our standpoint to go home.   [name] 01/03/2014

## 2014-01-04 DIAGNOSIS — Z89511 Acquired absence of right leg below knee: Secondary | ICD-10-CM

## 2014-01-04 DIAGNOSIS — Z89512 Acquired absence of left leg below knee: Secondary | ICD-10-CM

## 2014-01-04 LAB — URINE CULTURE: Colony Count: 60000

## 2014-01-04 LAB — GLUCOSE, CAPILLARY
GLUCOSE-CAPILLARY: 66 mg/dL — AB (ref 70–99)
GLUCOSE-CAPILLARY: 83 mg/dL (ref 70–99)
Glucose-Capillary: 78 mg/dL (ref 70–99)

## 2014-01-04 LAB — BASIC METABOLIC PANEL
BUN: 43 mg/dL — AB (ref 6–23)
CO2: 22 meq/L (ref 19–32)
Calcium: 9.7 mg/dL (ref 8.4–10.5)
Chloride: 105 mEq/L (ref 96–112)
Creatinine, Ser: 1.51 mg/dL — ABNORMAL HIGH (ref 0.50–1.10)
GFR calc Af Amer: 40 mL/min — ABNORMAL LOW (ref >90)
GFR, EST NON AFRICAN AMERICAN: 34 mL/min — AB (ref >90)
GLUCOSE: 76 mg/dL (ref 70–99)
POTASSIUM: 4.8 meq/L (ref 3.7–5.3)
Sodium: 141 mEq/L (ref 137–147)

## 2014-01-04 MED ORDER — POLYETHYLENE GLYCOL 3350 17 G PO PACK: 17.0000 g | PACK | Freq: Every day | ORAL | Status: AC

## 2014-01-04 MED ORDER — RANOLAZINE ER 500 MG PO TB12: 500.0000 mg | ORAL_TABLET | Freq: Two times a day (BID) | ORAL | Status: AC

## 2014-01-04 MED ORDER — INSULIN ASPART PROT & ASPART (70-30 MIX) 100 UNIT/ML ~~LOC~~ SUSP: 25.0000 [IU] | Freq: Every day | SUBCUTANEOUS | Status: DC

## 2014-01-04 MED ORDER — INSULIN NPH ISOPHANE & REGULAR (70-30) 100 UNIT/ML ~~LOC~~ SUSP: SUBCUTANEOUS | Status: AC

## 2014-01-04 MED ORDER — ACETAMINOPHEN 325 MG PO TABS: 650.0000 mg | ORAL_TABLET | Freq: Four times a day (QID) | ORAL | Status: AC | PRN

## 2014-01-04 MED ORDER — INSULIN ASPART PROT & ASPART (70-30 MIX) 100 UNIT/ML ~~LOC~~ SUSP: 35.0000 [IU] | Freq: Every day | SUBCUTANEOUS | Status: DC

## 2014-01-04 NOTE — Progress Notes (Signed)
    Subjective:  Vague historian, denies chest pain. She has not had diarrhea since admission.  Objective:  Vital Signs in the last 24 hours: Temp:  [98.1 F (36.7 C)-98.5 F (36.9 C)] 98.5 F (36.9 C) (04/18 0658) Pulse Rate:  [73-76] 73 (04/18 0949) Resp:  [16-18] 18 (04/18 0949) BP: (116-132)/(47-52) 123/50 mmHg (04/18 0949) SpO2:  [99 %-100 %] 99 % (04/18 0949) Weight:  [147 lb 7.8 oz (66.9 kg)] 147 lb 7.8 oz (66.9 kg) (04/18 0658)  Intake/Output from previous day:  Intake/Output Summary (Last 24 hours) at 01/04/14 1030 Last data filed at 01/04/14 0956  Gross per 24 hour  Intake   1563 ml  Output   1400 ml  Net    163 ml    Physical Exam: General appearance: alert, cooperative and no distress Lungs: clear to auscultation bilaterally Heart: regular rate and rhythm Extremities: bilat BKA   Rate: 74  Rhythm: normal sinus rhythm  Lab Results:  Recent Labs  01/02/14 0355 01/03/14 0500  WBC 8.1 9.8  HGB 9.5* 10.0*  PLT 269 261    Recent Labs  01/02/14 1055 01/04/14 0422  NA 138 141  K 4.8 4.8  CL 100 105  CO2 21 22  GLUCOSE 201* 76  BUN 35* 43*  CREATININE 1.25* 1.51*   No results found for this basename: TROPONINI, CK, MB,  in the last 72 hours No results found for this basename: INR,  in the last 72 hours  Imaging: Imaging results have been reviewed  Cardiac Studies:  Assessment/Plan:   Principal Problem:   Acute on chronic systolic congestive heart failure, NYHA class 4 Active Problems:   Cardiomyopathy, ischemic   DM type 1 (diabetes mellitus, type 1)   Hx of CABG'99 at [facility]- 2/3 graft occluded 12/31/13   CKD (chronic kidney disease) stage 3, GFR 30-59 ml/min   Diarrhea   Anemia   Depression with anxiety   Hypertension   Hyperlipidemia   Hyperkalemia   AV block, 2nd degree   S/P BKA (below knee amputation) bilateral    PLAN: Slight bump in SCr today to 1.5. I don't think she needs GI to see her before discharge but will review  with MD. PT worked with her this am and feels she is at baseline. Discharge today- BMP in one week, follow up with her cardiologist and GI in [place].   Kerin Ransom PA-C Beeper [phone] 01/04/2014, 10:30 AM

## 2014-01-04 NOTE — Progress Notes (Addendum)
Patient alert and oriented x4, transferred to chair with PT assistance.  Patient discharged to home with family.  IV removed prior to discharge; IV site clean, dry, and intact.  Discharge instructions, education, and medications discussed with patient and patient's granddaughter.  Both voiced understanding of discharge information.

## 2014-01-04 NOTE — Progress Notes (Signed)
Hypoglycemic Event  CBG: 66  Treatment: 15 GM carbohydrate snack  Symptoms: None  Follow-up CBG: Time: 11:19 CBG Result: 83  Possible Reasons for Event: Medication regimen: NPH 70/30 insulin  Comments/MD notified: Kerin Ransom, PA notified    Mechele Dawley  Remember to initiate Hypoglycemia Order Set & complete

## 2014-01-04 NOTE — Progress Notes (Signed)
Patient Name: Christine Hughes Date of Encounter: 01/04/2014     Principal Problem:   Acute on chronic systolic congestive heart failure, NYHA class 4 Active Problems:   Anemia   Cardiomyopathy, ischemic   Depression with anxiety   DM type 1 (diabetes mellitus, type 1)   CAD (coronary artery disease)   Hx of CABG   CKD (chronic kidney disease) stage 3, GFR 30-59 ml/min   Hypertension   Hyperlipidemia   Diarrhea   Midsternal chest pain   Hyperkalemia   AV block, 2nd degree    SUBJECTIVE  Patient has no specific complaints today.  No chest pain. Not dyspneic. Rhythm stable NSR.  CURRENT MEDS . aspirin  81 mg Oral Daily  . atorvastatin  80 mg Oral Daily  . carvedilol  6.25 mg Oral BID WC  . citalopram  20 mg Oral Daily  . donepezil  10 mg Oral Daily  . insulin aspart  0-15 Units Subcutaneous TID WC  . insulin aspart protamine- aspart  34 Units Subcutaneous Q supper  . insulin aspart protamine- aspart  48 Units Subcutaneous Q breakfast  . isosorbide mononitrate  30 mg Oral Daily  . pantoprazole  40 mg Oral Daily  . ranolazine  500 mg Oral BID  . sodium chloride  3 mL Intravenous Q12H  . traZODone  100 mg Oral QHS    OBJECTIVE  Filed Vitals:   01/03/14 1405 01/03/14 2150 01/04/14 0658 01/04/14 0949  BP: 116/47 123/50 132/52 123/50  Pulse: 76 75 75 73  Temp: 98.3 F (36.8 C) 98.1 F (36.7 C) 98.5 F (36.9 C)   TempSrc: Oral Oral Oral   Resp: 16 16 16 18   Height:      Weight:   147 lb 7.8 oz (66.9 kg)   SpO2: 99% 99% 100% 99%    Intake/Output Summary (Last 24 hours) at 01/04/14 1026 Last data filed at 01/04/14 0956  Gross per 24 hour  Intake   1563 ml  Output   1400 ml  Net    163 ml   Filed Weights   01/02/14 0645 01/03/14 0538 01/04/14 0658  Weight: 146 lb 14.4 oz (66.633 kg) 146 lb 9.7 oz (66.5 kg) 147 lb 7.8 oz (66.9 kg)    PHYSICAL EXAM  General: Well developed, well nourished WF in no acute distress. Appears depressed. Head: Normocephalic,  atraumatic, sclera non-icteric, no xanthomas, nares are without discharge.  Neck: Negative for carotid bruits. JVP not elevated.  Lungs: Clear bilaterally to auscultation without wheezes, rales, or rhonchi. Breathing is unlabored.  Heart: RRR S1 S2 without murmurs, rubs, or gallops.  Abdomen: Soft, non-tender, non-distended with normoactive bowel sounds. No rebound/guarding.  Extremities: No clubbing or cyanosis. S/p bilat BKA. No edema.  Neuro: Alert and oriented X 3.  Psych: Responds to questions appropriately with a normal affect.   Accessory Clinical Findings  CBC  Recent Labs  01/02/14 0355 01/03/14 0500  WBC 8.1 9.8  HGB 9.5* 10.0*  HCT 31.1* 31.9*  MCV 94.5 94.4  PLT 269 267   Basic Metabolic Panel  Recent Labs  01/02/14 1055 01/04/14 0422  NA 138 141  K 4.8 4.8  CL 100 105  CO2 21 22  GLUCOSE 201* 76  BUN 35* 43*  CREATININE 1.25* 1.51*  CALCIUM 9.6 9.7   Liver Function Tests No results found for this basename: AST, ALT, ALKPHOS, BILITOT, PROT, ALBUMIN,  in the last 72 hours No results found for this basename: LIPASE, AMYLASE,  in the last 72 hours Cardiac Enzymes No results found for this basename: CKTOTAL, CKMB, CKMBINDEX, TROPONINI,  in the last 72 hours BNP No components found with this basename: POCBNP,  D-Dimer No results found for this basename: DDIMER,  in the last 72 hours Hemoglobin A1C No results found for this basename: HGBA1C,  in the last 72 hours Fasting Lipid Panel No results found for this basename: CHOL, HDL, LDLCALC, TRIG, CHOLHDL, LDLDIRECT,  in the last 72 hours Thyroid Function Tests No results found for this basename: TSH, T4TOTAL, FREET3, T3FREE, THYROIDAB,  in the last 72 hours  TELE  NSR.  First degree AV block  ECG    Radiology/Studies  Dg Chest 2 View  12/25/2013   CLINICAL DATA:  Shortness of breath.  EXAM: CHEST  2 VIEW  COMPARISON:  12/12/2012  FINDINGS: The heart is enlarged but stable. Stable surgical changes  from bypass surgery. There are chronic bronchitic type lung changes with probable superimposed bronchitis or interstitial pneumonitis. No focal airspace consolidation or pleural effusion.  IMPRESSION: Chronic underlying lung changes with probable superimposed bronchitis.   Electronically Signed   By: Kalman Jewels M.D.   On: 12/25/2013 18:45    ASSESSMENT AND PLAN 1. Acute on chronic systolic CHF/ICM: EF 38-46% by echo report from earlier this year in [place]  2. CAD s/p prior CABG: Recent NSTEMI with recurrent chest pain here, cath 4/14 with 3V with 2/3 grafts occluded, not felt to be a redo CABG candidate  3. Bradycardia with 2nd degree heart block: transient 12/31/13 with brief 2:1 pattern .  Now in stable first degree AV block 4. DM s/p bilat BKA  5. CKD III with hyperkalemia this admission  6. HTN  7. HL  8. Diarrhea, felt chronic and multifactorial, no apparent further GI workup planned at this time.  9. Chronic anemia, appears stable  Okay for discharge today with followup with her cardiologist and PCP in [place].  Signed, Darlin Coco MD

## 2014-01-04 NOTE — Discharge Summary (Signed)
Patient ID: Christine Hughes,  MRN: [id number], DOB/AGE: 69-Oct-1947 70 y.o.  Admit date: 12/25/2013 Discharge date: 01/04/2014  Primary Care Provider:OBEROI, Clide Dales, MD ([place])  Primary Cardiologist: In [place]/ Dr Debara Pickett saw her here  Discharge Diagnoses Principal Problem:   Acute on chronic systolic congestive heart failure, NYHA class 4 Active Problems:   Cardiomyopathy, ischemic   DM type 1 (diabetes mellitus, type 1)   Hx of CABG'99 at [facility]- 2/3 graft occluded 12/31/13   CKD (chronic kidney disease) stage 3, GFR 30-59 ml/min   Diarrhea   Anemia   Depression with anxiety   Hypertension   Hyperlipidemia   Hyperkalemia   AV block, 2nd degree   S/P BKA (below knee amputation) bilateral    Procedures: Coronary angiogram 12/31/13   Hospital Course:  This is a 68 y.o. female with a past medical history significant for CAD (CABG in 1999 at [place]), bilat BKA,  HTN, DM2, GERD, L Breast CA, chronic diarrhea with prior negative work up, and dementia. She believes she was last intervened upon sometime in 2009 or 2010, in [place]. She does experience periodic chest pain, usually at rest, approx 1x every few mos. She rarely has to use sl NTG. She has a long h/o colitis. She was seen in 12/12/2012 in consult for NSTEMI/CAD in the setting of colitis and diarrhea. Due to her CKD, catheterization was not recommended. She had an echocardiogram which showed an EF of 45%, with inferior and inferolateral hypokinesis. She had not complained of chest pain. She is a bilateral amputee due to diabetes, she is able to ambulate some with prosthetics. She was just discharged from [facility] for pneumonia, sepsis, acute renal failure up to creatinine of 5. She also had NSTEMI with troponin that peaked just over 2. No intervention was performed. An echo was repeated which shows the EF is now 40-45% with clear inferior wall motion abnormalities. After discharge, her family did not feel  that she was adequately treated and drove her to [facility] for care. She was admitted through toe ER 12/25/13. Her BNP on admission was 12,286. Creatinine is 1.42. She was admitted and diuresed. Her SCr was followed closely. She was seen in consult by GI (Dr Benson Norway was on call gastroenterologist). No in pt work up planned. Her GI in [place] is Dr Earley Brooke.  She actually did not have diarrhea during this admission.                      It was decided to proceed with diagnostic cath 12/31/13. This revealed her SVGs to OM and RCA were occluded. The LIMA to LAD was patent but she had distal LAD disease. She was seen in consult by Dr Cyndia Bent and he did not feel re do CABG was an option. The plan is for medical Rx. She was transferred to telemetry and mobilized. We feel she is stable for discharge 01/04/14. She can follow up with her cardiologist and gastroenterologist in [place]. She will need a BMP in one week.    Discharge Vitals:  Blood pressure 123/50, pulse 73, temperature 98.5 F (36.9 C), temperature source Oral, resp. rate 18, height $RemoveBe'5\' 3"'[id number]$  (1.6 m), weight 147 lb 7.8 oz (66.9 kg), SpO2 99.00%.    Labs: Results for orders placed during the hospital encounter of 12/25/13 (from the past 48 hour(s))  BASIC METABOLIC PANEL     Status: Abnormal   Collection Time    01/02/14 10:55 AM  Result Value Ref Range   Sodium 138  137 - 147 mEq/L   Potassium 4.8  3.7 - 5.3 mEq/L   Chloride 100  96 - 112 mEq/L   CO2 21  19 - 32 mEq/L   Glucose, Bld 201 (*) 70 - 99 mg/dL   BUN 35 (*) 6 - 23 mg/dL   Creatinine, Ser 1.25 (*) 0.50 - 1.10 mg/dL   Calcium 9.6  8.4 - 10.5 mg/dL   GFR calc non Af Amer 43 (*) >90 mL/min   GFR calc Af Amer 50 (*) >90 mL/min   Comment: (NOTE)     The eGFR has been calculated using the CKD EPI equation.     This calculation has not been validated in all clinical situations.     eGFR's persistently <90 mL/min signify possible Chronic Kidney     Disease.  GLUCOSE, CAPILLARY      Status: Abnormal   Collection Time    01/02/14 11:33 AM      Result Value Ref Range   Glucose-Capillary 165 (*) 70 - 99 mg/dL   Comment 1 Notify RN    GLUCOSE, CAPILLARY     Status: Abnormal   Collection Time    01/02/14  4:46 PM      Result Value Ref Range   Glucose-Capillary 113 (*) 70 - 99 mg/dL   Comment 1 Notify RN    GLUCOSE, CAPILLARY     Status: Abnormal   Collection Time    01/02/14  9:17 PM      Result Value Ref Range   Glucose-Capillary 48 (*) 70 - 99 mg/dL  URINALYSIS, ROUTINE W REFLEX MICROSCOPIC     Status: Abnormal   Collection Time    01/02/14 10:02 PM      Result Value Ref Range   Color, Urine YELLOW  YELLOW   APPearance TURBID (*) CLEAR   Specific Gravity, Urine 1.013  1.005 - 1.030   pH 6.5  5.0 - 8.0   Glucose, UA NEGATIVE  NEGATIVE mg/dL   Hgb urine dipstick SMALL (*) NEGATIVE   Bilirubin Urine NEGATIVE  NEGATIVE   Ketones, ur NEGATIVE  NEGATIVE mg/dL   Protein, ur NEGATIVE  NEGATIVE mg/dL   Urobilinogen, UA 0.2  0.0 - 1.0 mg/dL   Nitrite NEGATIVE  NEGATIVE   Leukocytes, UA LARGE (*) NEGATIVE  URINE MICROSCOPIC-ADD ON     Status: None   Collection Time    01/02/14 10:02 PM      Result Value Ref Range   Squamous Epithelial / LPF RARE  RARE   WBC, UA TOO NUMEROUS TO COUNT  <3 WBC/hpf   RBC / HPF 0-2  <3 RBC/hpf   Bacteria, UA RARE  RARE  GLUCOSE, CAPILLARY     Status: None   Collection Time    01/02/14 10:06 PM      Result Value Ref Range   Glucose-Capillary 92  70 - 99 mg/dL  CBC     Status: Abnormal   Collection Time    01/03/14  5:00 AM      Result Value Ref Range   WBC 9.8  4.0 - 10.5 K/uL   RBC 3.38 (*) 3.87 - 5.11 MIL/uL   Hemoglobin 10.0 (*) 12.0 - 15.0 g/dL   HCT 31.9 (*) 36.0 - 46.0 %   MCV 94.4  78.0 - 100.0 fL   MCH 29.6  26.0 - 34.0 pg   MCHC 31.3  30.0 - 36.0 g/dL   RDW 15.7 (*)  11.5 - 15.5 %   Platelets 261  150 - 400 K/uL  GLUCOSE, CAPILLARY     Status: None   Collection Time    01/03/14  5:46 AM      Result Value Ref  Range   Glucose-Capillary 92  70 - 99 mg/dL  GLUCOSE, CAPILLARY     Status: Abnormal   Collection Time    01/03/14 11:32 AM      Result Value Ref Range   Glucose-Capillary 117 (*) 70 - 99 mg/dL   Comment 1 Notify RN    GLUCOSE, CAPILLARY     Status: Abnormal   Collection Time    01/03/14  4:04 PM      Result Value Ref Range   Glucose-Capillary 109 (*) 70 - 99 mg/dL   Comment 1 Notify RN    GLUCOSE, CAPILLARY     Status: None   Collection Time    01/03/14  9:21 PM      Result Value Ref Range   Glucose-Capillary 74  70 - 99 mg/dL  BASIC METABOLIC PANEL     Status: Abnormal   Collection Time    01/04/14  4:22 AM      Result Value Ref Range   Sodium 141  137 - 147 mEq/L   Potassium 4.8  3.7 - 5.3 mEq/L   Chloride 105  96 - 112 mEq/L   CO2 22  19 - 32 mEq/L   Glucose, Bld 76  70 - 99 mg/dL   BUN 43 (*) 6 - 23 mg/dL   Creatinine, Ser 1.51 (*) 0.50 - 1.10 mg/dL   Calcium 9.7  8.4 - 10.5 mg/dL   GFR calc non Af Amer 34 (*) >90 mL/min   GFR calc Af Amer 40 (*) >90 mL/min   Comment: (NOTE)     The eGFR has been calculated using the CKD EPI equation.     This calculation has not been validated in all clinical situations.     eGFR's persistently <90 mL/min signify possible Chronic Kidney     Disease.  GLUCOSE, CAPILLARY     Status: None   Collection Time    01/04/14  6:28 AM      Result Value Ref Range   Glucose-Capillary 78  70 - 99 mg/dL    Disposition:  Follow-up Information   Please follow up. (Call your cardiologist for follow up)       Discharge Medications:    Medication List    STOP taking these medications       lisinopril 2.5 MG tablet  Commonly known as:  PRINIVIL,ZESTRIL      TAKE these medications       acetaminophen 325 MG tablet  Commonly known as:  TYLENOL  Take 2 tablets (650 mg total) by mouth every 6 (six) hours as needed for mild pain or headache.     ALPRAZolam 0.5 MG tablet  Commonly known as:  XANAX  Take 0.5 mg by mouth 3 (three) times  daily as needed for anxiety.     aspirin 81 MG chewable tablet  Chew 1 tablet (81 mg total) by mouth daily.     atorvastatin 80 MG tablet  Commonly known as:  LIPITOR  Take 80 mg by mouth daily.     carvedilol 6.25 MG tablet  Commonly known as:  COREG  Take 6.25 mg by mouth 2 (two) times daily with a meal.     citalopram 40 MG tablet  Commonly known as:  CELEXA  Take 40 mg by mouth daily.     clopidogrel 75 MG tablet  Commonly known as:  PLAVIX  Take 75 mg by mouth daily.     donepezil 10 MG tablet  Commonly known as:  ARICEPT  Take 10 mg by mouth daily.     esomeprazole 40 MG capsule  Commonly known as:  NEXIUM  Take 40 mg by mouth daily before breakfast.     furosemide 20 MG tablet  Commonly known as:  LASIX  Take 1 tablet (20 mg total) by mouth daily.     insulin NPH-regular Human (70-30) 100 UNIT/ML injection  Commonly known as:  NOVOLIN 70/30  Inject 34-48 Units into the skin. 48 units in AM, 34 units in PM     isosorbide mononitrate 30 MG 24 hr tablet  Commonly known as:  IMDUR  Take 0.5 tablets (15 mg total) by mouth daily.     nitroGLYCERIN 0.4 MG SL tablet  Commonly known as:  NITROSTAT  Place 0.4 mg under the tongue every 5 (five) minutes as needed for chest pain.     polyethylene glycol packet  Commonly known as:  MIRALAX / GLYCOLAX  Take 17 g by mouth daily.     ranolazine 500 MG 12 hr tablet  Commonly known as:  RANEXA  Take 1 tablet (500 mg total) by mouth 2 (two) times daily.     traZODone 100 MG tablet  Commonly known as:  DESYREL  Take 0.5 tablets (50 mg total) by mouth at bedtime.         Duration of Discharge Encounter: Greater than 30 minutes including physician time.  Angelena Form PA-C 01/04/2014 10:39 AM

## 2014-01-04 NOTE — Discharge Instructions (Signed)
Angina Pectoris Angina pectoris is extreme discomfort in your chest, neck, or arm. Your doctor may call it just angina. It is caused by a lack of oxygen to your heart wall. It may feel like tightness or heavy pressure. It may feel like a crushing or squeezing pain. Some people say it feels like gas. It may go down your shoulders, back, and arms. Some people have symptoms other than pain. These include:  Tiredness.  Shortness of breath.  Cold sweats.  Feeling sick to your stomach (nausea). There are four types of angina:  Stable angina often lasts the same amount of time each time it happens. Activity, stress, or excitement can bring it on. It often gets better after taking a special medicine called nitroglycerin. This goes under your tongue.  Unstable angina can happen when you are not active or even during sleep. It can suddenly get worse or happen more often. It may not get better after taking the special medicine. It can last up to 30 minutes.  Microvascular angina is more common in women. It may be more severe or last longer than other types.  Prinzmetal angina often happens when you are not active or in the early morning hours. HOME CARE   Only take medicines as told by your doctor.  Stay active or exercise more as told by your doctor.  Limit very hard activity as told by your doctor.  Limit heavy lifting as told by your doctor.  Keep a healthy weight.  Learn about and eat foods that are healthy for your heart.  Do not smoke. GET HELP RIGHT AWAY IF:   You have chest, neck, deep shoulder, or arm pain or discomfort that lasts more than a few minutes.  You have chest, neck, deep shoulder, or arm pain or discomfort that goes away and comes back over and over again.  You have heavy sweating that seems to happen for no reason.  You have shortness of breath or trouble breathing.  Your angina does not get better after a few minutes of rest.  Your angina does not get better  after you take nitroglycerin medicine. These can all be symptoms of a heart attack. Get help right away. Call your local emergency service (911 in [place] Do not  drive yourself to the hospital. Do not  wait to for your symptoms to go away. MAKE SURE YOU:   Understand these instructions.  Will watch your condition.  Will get help right away if you are not doing well or get worse. Document Released: 02/22/2008 Document Revised: 08/22/2012 Document Reviewed: 06/14/2012 [facility] Patient Information 2014 [place], [place].

## 2014-01-04 NOTE — Progress Notes (Signed)
Physical Therapy Re-Evaluation Patient Details Name: Christine Hughes MRN: [id number] DOB: September 14, 1946 Today's Date: 01/04/2014   History of Present Illness  This is a 68 y.o. female with a past medical history significant for CAD (CABG in 1999 at [facility]), history of PE, HTN, DM2, GERD, L Breast CA, and dementia. She also has a h/o DM and PVD and is s/p bilat BKA's. Patient admitted with CHF exacerbation.  Heart cath revealed 3 vessel disease, treating medically.  Clinical Impression  Patient presents with problems listed below.  Patient demonstrated AP transfers bed to chair - reports baseline with transfers.  Recommend HHPT at discharge for continued therapy due to h/o multiple falls.    Follow Up Recommendations Home health PT;Supervision - Intermittent    Equipment Recommendations  None recommended by PT    Recommendations for Other Services       Precautions / Restrictions Precautions Precautions: Fall Required Braces or Orthoses: Other Brace/Splint Other Brace/Splint: bil LE prostheses Restrictions Weight Bearing Restrictions: No RLE Weight Bearing: Non weight bearing LLE Weight Bearing: Non weight bearing      Mobility  Bed Mobility Overal bed mobility: Modified Independent             General bed mobility comments: Patient able to roll and move to sitting with HOB flat and no rail.  Transfers Overall transfer level: Modified independent Equipment used: None             General transfer comment: Patient performed posterior scooting transfer from bed to chair with supervision for safety only.   Reports she does AP transfers at home.  Ambulation/Gait             General Gait Details: Patient non-ambulatory  Stairs            Wheelchair Mobility    Modified Rankin (Stroke Patients Only)       Balance                                             Pertinent Vitals/Pain     Home Living Family/patient expects to be  discharged to:: Private residence Living Arrangements: Alone Available Help at Discharge: Family;Available PRN/intermittently Type of Home: Apartment Home Access: Level entry     Home Layout: One level Home Equipment: Walker - 2 wheels;Shower seat;Grab bars - toilet;Grab bars - tub/shower;Wheelchair - Education administrator (comment)      Prior Function Level of Independence: Independent with assistive device(s);Needs assistance   Gait / Transfers Assistance Needed: non-ambulatory x 1 yr due to "bone on metal" hip pain; uses bil prostheses to transfer to/from w/c; can propel herself however is 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  ADL's / Homemaking Assistance Needed: modified independent with ADLs; assist with homemaking and shopping        Hand Dominance        Extremity/Trunk Assessment   Upper Extremity Assessment: Overall WFL for tasks assessed           Lower Extremity Assessment: Generalized weakness (Bil. BKA)         Communication   Communication: No difficulties  Cognition Arousal/Alertness: Awake/alert Behavior During Therapy: Flat affect Overall Cognitive Status: No family/caregiver present to determine baseline cognitive functioning                      General Comments      Exercises  Assessment/Plan    PT Assessment All further PT needs can be met in the next venue of care  PT Diagnosis Generalized weakness;Acute pain;Difficulty walking   PT Problem List Decreased strength;Decreased balance;Other (comment)  PT Treatment Interventions     PT Goals (Current goals can be found in the Care Plan section) Acute Rehab PT Goals Patient Stated Goal: To go home PT Goal Formulation: No goals set, d/c therapy    Frequency     Barriers to discharge        Co-evaluation               End of Session Equipment Utilized During Treatment: Gait belt Activity Tolerance: Patient tolerated treatment well Patient left: in chair;with call bell/phone  within reach Nurse Communication: Mobility status (AP transfers)         Time: 1146-4314 PT Time Calculation (min): 30 min   Charges:   PT Evaluation $PT Re-evaluation: 1 Procedure PT Treatments $Therapeutic Activity: 8-22 mins   PT G Codes:          Despina Pole 01-13-2014, 10:35 AM Carita Pian. Sanjuana Kava, [place] Pager [phone]

## 2014-01-06 ENCOUNTER — Telehealth: Payer: Self-pay | Admitting: Cardiology

## 2014-01-06 NOTE — Telephone Encounter (Signed)
Called to pharmacy to inform that should defer insulin/supplies to PCP

## 2014-01-06 NOTE — Telephone Encounter (Signed)
She need a prescription for either the pens or the syringes.

## 2014-08-28 ENCOUNTER — Encounter (HOSPITAL_COMMUNITY): Payer: Self-pay | Admitting: Interventional Cardiology

## 2015-02-08 IMAGING — CR DG CHEST 2V
2 series · 2 of 2 positions shown · non-contrast
Comparison: 12/12/2012

CLINICAL DATA: Shortness of breath.

EXAM:
CHEST  2 VIEW

[w chest lat]
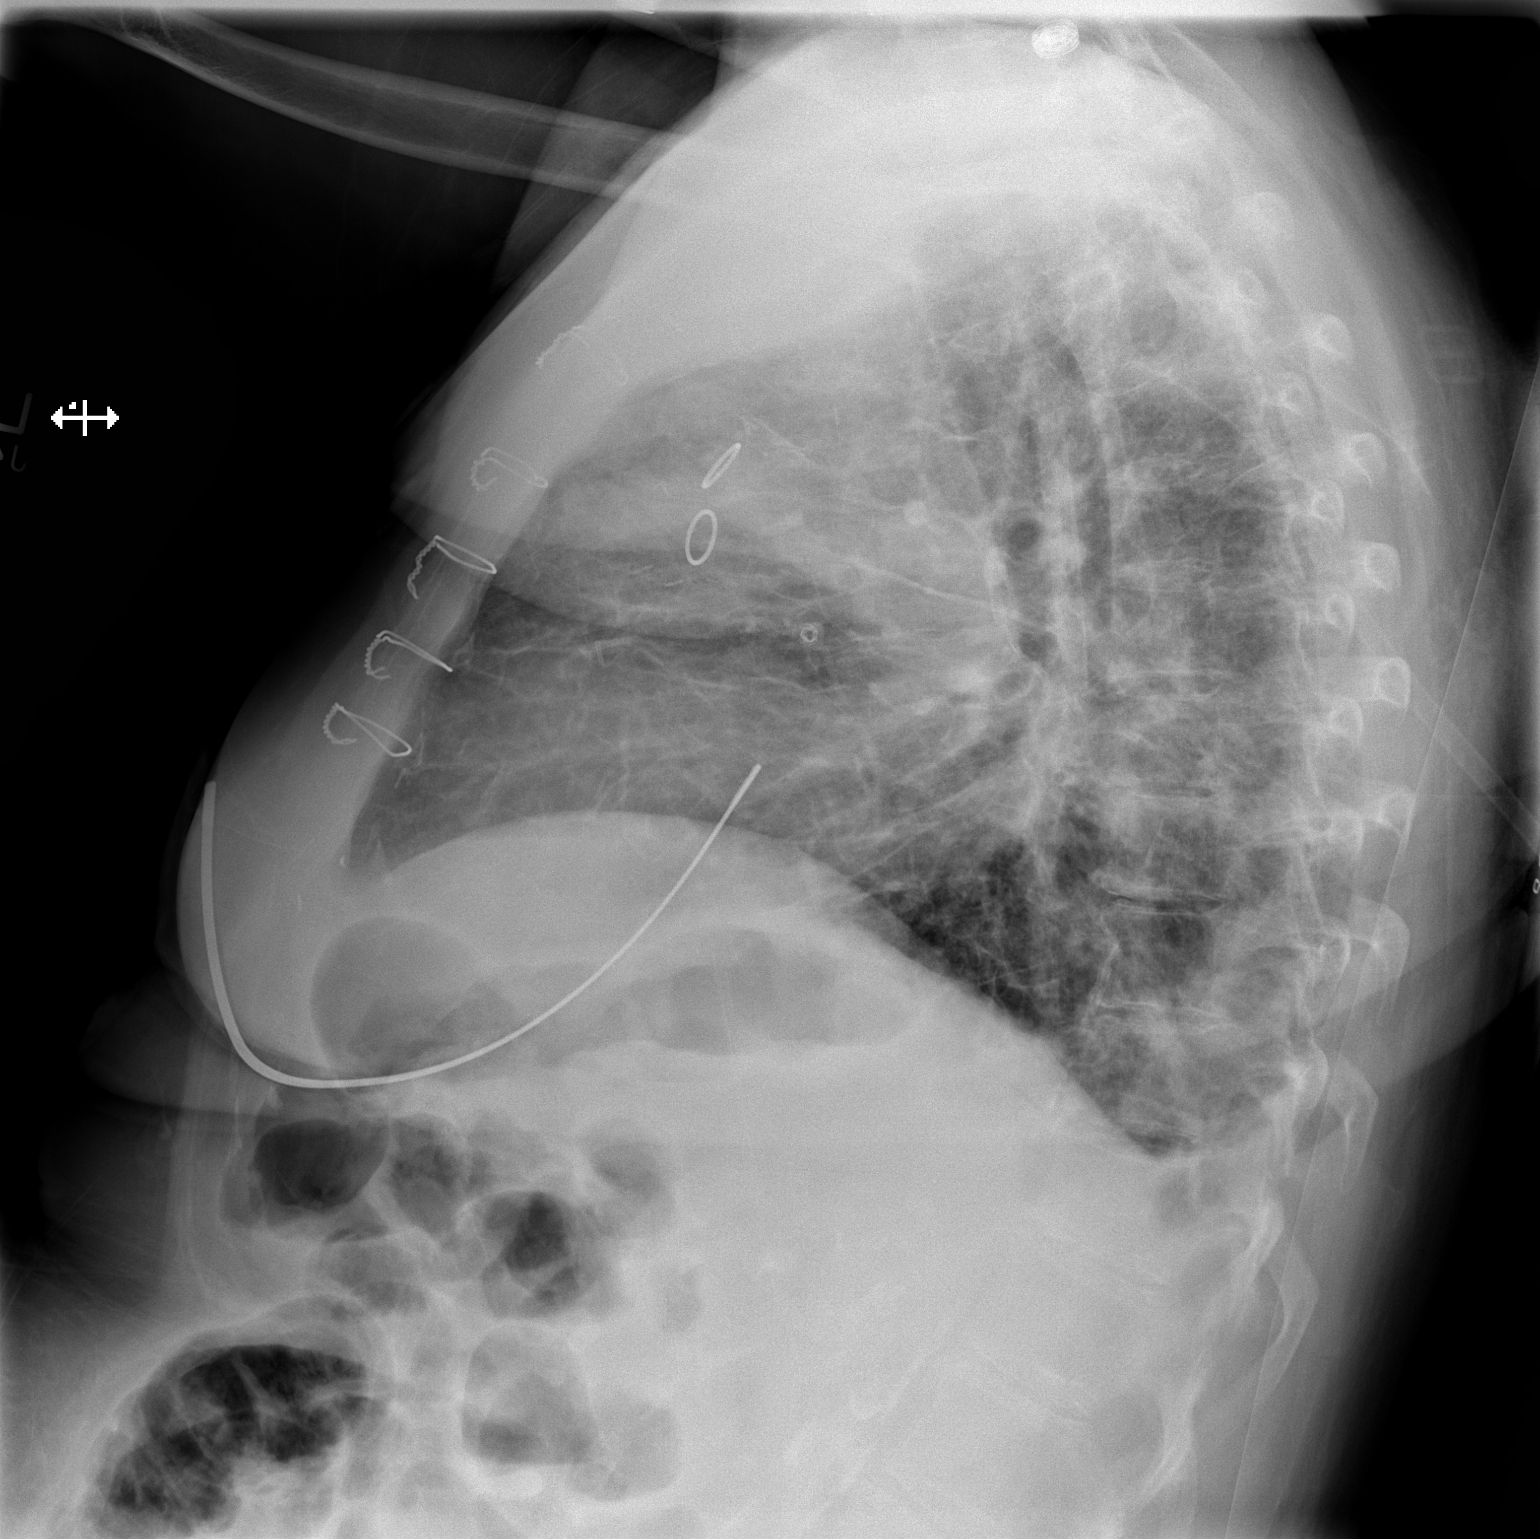

[x chest ap]
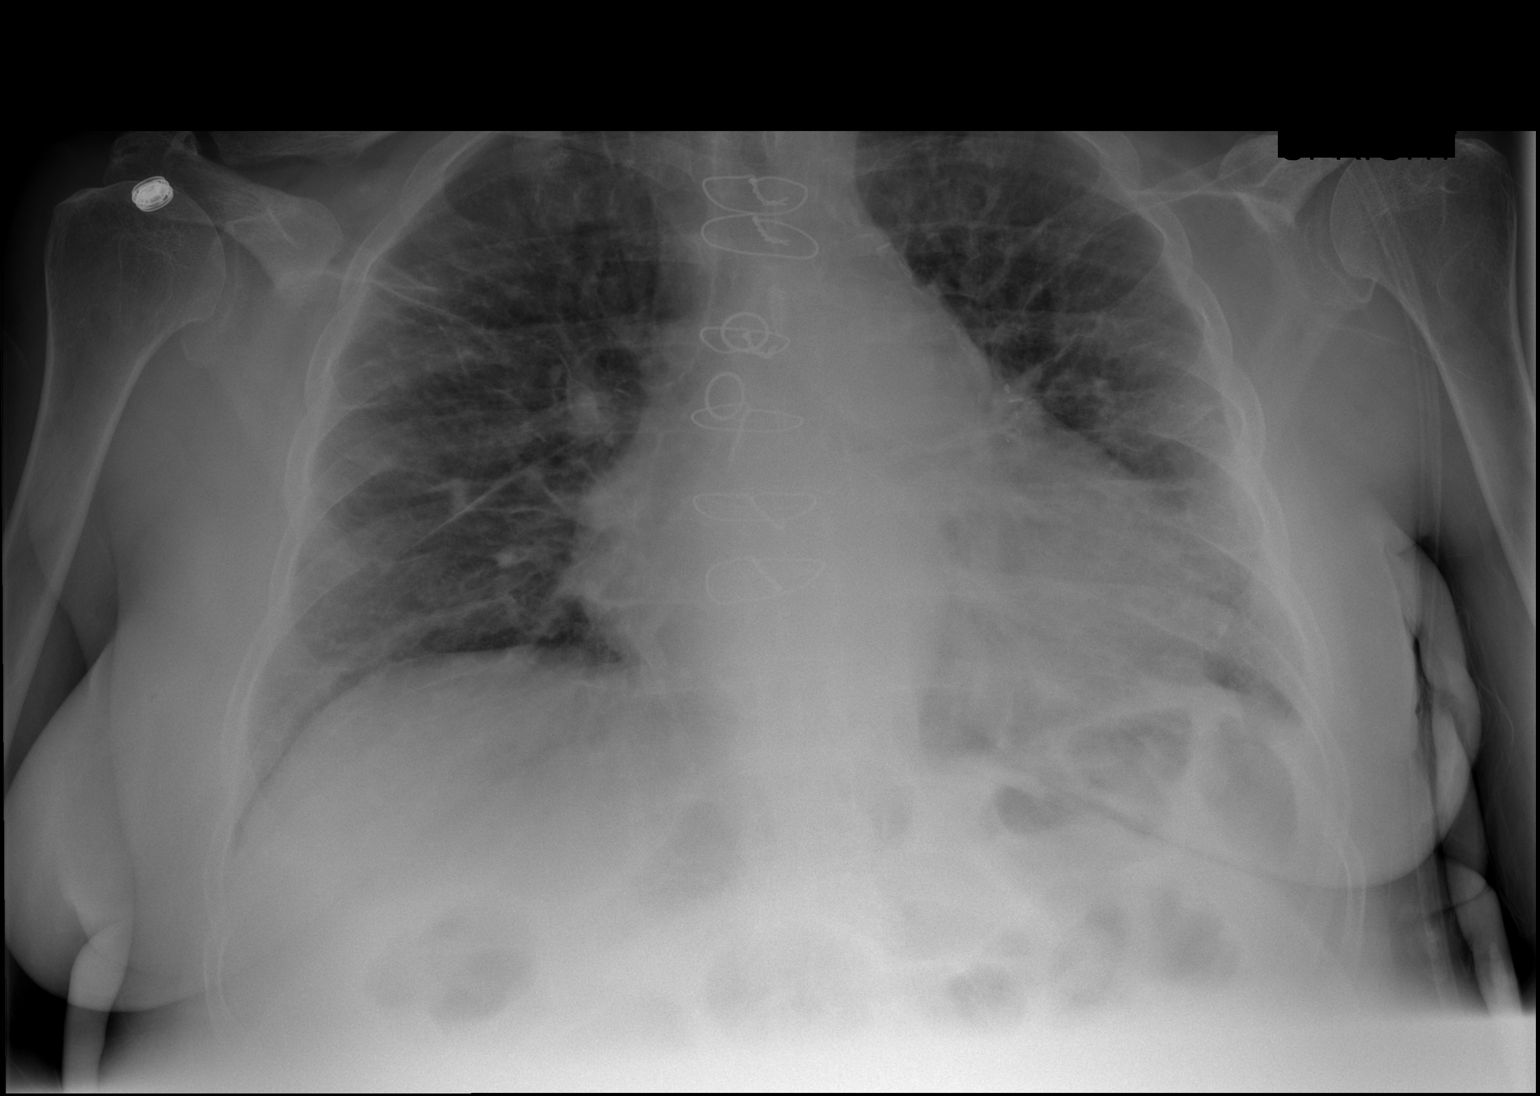

[2 of 2 positions shown; findings below may reference images not displayed]

FINDINGS: The heart is enlarged but stable. Stable surgical changes from
bypass surgery. There are chronic bronchitic type lung changes with
probable superimposed bronchitis or interstitial pneumonitis. No
focal airspace consolidation or pleural effusion.
IMPRESSION: Chronic underlying lung changes with probable superimposed
bronchitis.

## 2017-09-19 DEATH — deceased
